# Patient Record
Sex: Female | Born: 1975
Health system: Southern US, Community
[De-identification: ages and names within clinical notes are randomized; demographics above are authoritative.]

## PROBLEM LIST (undated history)

## (undated) DIAGNOSIS — L309 Dermatitis, unspecified: Secondary | ICD-10-CM

## (undated) DIAGNOSIS — I872 Venous insufficiency (chronic) (peripheral): Secondary | ICD-10-CM

## (undated) DIAGNOSIS — T783XXA Angioneurotic edema, initial encounter: Secondary | ICD-10-CM

## (undated) DIAGNOSIS — L509 Urticaria, unspecified: Secondary | ICD-10-CM

## (undated) DIAGNOSIS — I8393 Asymptomatic varicose veins of bilateral lower extremities: Secondary | ICD-10-CM

## (undated) HISTORY — DX: Angioneurotic edema, initial encounter: T78.3XXA

## (undated) HISTORY — DX: Venous insufficiency (chronic) (peripheral): I87.2

## (undated) HISTORY — DX: Urticaria, unspecified: L50.9

## (undated) HISTORY — DX: Dermatitis, unspecified: L30.9

## (undated) HISTORY — DX: Asymptomatic varicose veins of bilateral lower extremities: I83.93

---

## 1997-08-10 ENCOUNTER — Inpatient Hospital Stay (HOSPITAL_COMMUNITY): Admission: AD | Admit: 1997-08-10 | Discharge: 1997-08-12 | Payer: Self-pay | Admitting: *Deleted

## 1997-10-09 ENCOUNTER — Inpatient Hospital Stay (HOSPITAL_COMMUNITY): Admission: AD | Admit: 1997-10-09 | Discharge: 1997-10-11 | Payer: Self-pay | Admitting: *Deleted

## 2006-10-08 ENCOUNTER — Ambulatory Visit: Payer: Self-pay | Admitting: Obstetrics and Gynecology

## 2006-10-08 ENCOUNTER — Inpatient Hospital Stay (HOSPITAL_COMMUNITY): Admission: AD | Admit: 2006-10-08 | Discharge: 2006-10-08 | Payer: Self-pay | Admitting: Obstetrics and Gynecology

## 2006-10-10 ENCOUNTER — Ambulatory Visit (HOSPITAL_COMMUNITY): Admission: RE | Admit: 2006-10-10 | Discharge: 2006-10-10 | Payer: Self-pay | Admitting: Obstetrics and Gynecology

## 2006-10-23 ENCOUNTER — Ambulatory Visit: Payer: Self-pay | Admitting: Vascular Surgery

## 2006-10-23 ENCOUNTER — Other Ambulatory Visit: Admission: RE | Admit: 2006-10-23 | Discharge: 2006-10-23 | Payer: Self-pay | Admitting: Obstetrics & Gynecology

## 2006-10-23 ENCOUNTER — Encounter: Payer: Self-pay | Admitting: Obstetrics & Gynecology

## 2006-10-23 ENCOUNTER — Encounter (INDEPENDENT_AMBULATORY_CARE_PROVIDER_SITE_OTHER): Payer: Self-pay | Admitting: Gynecology

## 2006-10-23 ENCOUNTER — Ambulatory Visit (HOSPITAL_COMMUNITY): Admission: RE | Admit: 2006-10-23 | Discharge: 2006-10-23 | Payer: Self-pay | Admitting: Obstetrics & Gynecology

## 2006-10-23 ENCOUNTER — Encounter: Payer: Self-pay | Admitting: Physician Assistant

## 2006-10-23 ENCOUNTER — Ambulatory Visit: Payer: Self-pay | Admitting: Obstetrics & Gynecology

## 2006-10-28 ENCOUNTER — Inpatient Hospital Stay (HOSPITAL_COMMUNITY): Admission: AD | Admit: 2006-10-28 | Discharge: 2006-10-31 | Payer: Self-pay | Admitting: Obstetrics and Gynecology

## 2006-10-28 ENCOUNTER — Ambulatory Visit: Payer: Self-pay | Admitting: Obstetrics & Gynecology

## 2006-10-29 ENCOUNTER — Encounter: Payer: Self-pay | Admitting: *Deleted

## 2006-11-04 ENCOUNTER — Ambulatory Visit: Payer: Self-pay | Admitting: Obstetrics & Gynecology

## 2006-11-07 ENCOUNTER — Ambulatory Visit: Payer: Self-pay | Admitting: Obstetrics and Gynecology

## 2006-11-14 ENCOUNTER — Ambulatory Visit: Payer: Self-pay | Admitting: Family Medicine

## 2006-11-21 ENCOUNTER — Ambulatory Visit: Payer: Self-pay | Admitting: Family Medicine

## 2006-11-28 ENCOUNTER — Ambulatory Visit: Payer: Self-pay | Admitting: Family Medicine

## 2006-12-05 ENCOUNTER — Ambulatory Visit: Payer: Self-pay | Admitting: Family Medicine

## 2006-12-12 ENCOUNTER — Ambulatory Visit: Payer: Self-pay | Admitting: Obstetrics & Gynecology

## 2006-12-19 ENCOUNTER — Ambulatory Visit: Payer: Self-pay | Admitting: Family Medicine

## 2006-12-26 ENCOUNTER — Ambulatory Visit: Payer: Self-pay | Admitting: Obstetrics & Gynecology

## 2007-01-02 ENCOUNTER — Ambulatory Visit: Payer: Self-pay | Admitting: Obstetrics & Gynecology

## 2007-01-09 ENCOUNTER — Ambulatory Visit: Payer: Self-pay | Admitting: Obstetrics & Gynecology

## 2007-01-16 ENCOUNTER — Ambulatory Visit: Payer: Self-pay | Admitting: Obstetrics & Gynecology

## 2007-01-23 ENCOUNTER — Ambulatory Visit: Payer: Self-pay | Admitting: *Deleted

## 2007-01-30 ENCOUNTER — Ambulatory Visit: Payer: Self-pay | Admitting: Obstetrics & Gynecology

## 2007-02-06 ENCOUNTER — Ambulatory Visit: Payer: Self-pay | Admitting: *Deleted

## 2007-02-06 ENCOUNTER — Other Ambulatory Visit: Payer: Self-pay | Admitting: Obstetrics & Gynecology

## 2007-02-09 ENCOUNTER — Ambulatory Visit: Payer: Self-pay | Admitting: *Deleted

## 2007-02-09 ENCOUNTER — Inpatient Hospital Stay (HOSPITAL_COMMUNITY): Admission: AD | Admit: 2007-02-09 | Discharge: 2007-02-11 | Payer: Self-pay | Admitting: Gynecology

## 2007-12-03 ENCOUNTER — Emergency Department (HOSPITAL_COMMUNITY): Admission: EM | Admit: 2007-12-03 | Discharge: 2007-12-03 | Payer: Self-pay | Admitting: Emergency Medicine

## 2010-08-08 NOTE — Discharge Summary (Signed)
[name], [name]                  ACCOUNT NO.:  [id number]   MEDICAL RECORD NO.:  [id number]          PATIENT TYPE:  INP   LOCATION:  9154                          FACILITY:  [facility]   PHYSICIAN:  Alison Carne, [name]  DATE OF BIRTH:  12/06/1975   DATE OF ADMISSION:  10/28/2006  DATE OF DISCHARGE:  10/31/2006                               DISCHARGE SUMMARY   ADMISSION DIAGNOSES:  61. 35 year old, gravida 8, para 2, 0, 5, 2, at 23 weeks 6 days      gestation.  2. Preterm labor.  3. Shortened cervix.   DISCHARGE DIAGNOSES:  59. 35 year old, gravida 8, para 2, 0, 5, 2, at 24 weeks 2 days      gestation.  2. Shortened cervix.  3. History of preterm labor.   PROCEDURES:  1. The patient had an ultrasound performed on October 29, 2006 for fetal      position and assessment of the cervix.  The cervix was noted to be      1.4 cm in length, measured transvaginally with significant      funneling at the internal os.  2. Beta Methasone injection 12 mg intramuscularly time two 24 hours      apart.   CONSULTATIONS:  Dr. Margot Ables with Maternal Fetal Medicine.   COMPLICATIONS:  None.   BRIEF PERTINENT ADMISSION HISTORY:  The patient is a 35 year old,  gravida 8, para 2, 0, 5, 2, admitted at 23 weeks 6 days gestation with  evidence of preterm labor.  She was assessed with ultrasound on October 10, 2006 with cervical length noted to be 3.1 cm.  She had a follow up  ultrasound on October 23, 2006 which showed a change in her cervical length  to 1.8 cm.  She was seen in clinic by Dr. Penne Lash and a cervical exam  digitally revealed a loose 1 cm cervix with 80% effacement.  She was  admitted for bed rest, tocolytic therapy with Procardia, and Maternal  Fetal Medicine consult.   HOSPITAL COURSE:  The patient was admitted, placed on bed rest with  fetal heart rate and tocometer monitoring three times daily.  She was  placed on Procardia 30 mg XL twice daily.  Beta Methasone injections  intramuscularly 12  mg 24 hours apart times 2 were administered.  The  patient was monitored with fetal heart tracing and tocometer three times  daily.  She was found not to be contracting and baby's heart rate was  reassuring.  She was assessed with an ultrasound on October 29, 2006  showing cervical length of 1.4 cm.  Maternal Fetal Medicine was  consulted and they recommended the addition of dilueton intramuscularly  250 mg weekly.  Fetal fibronectin was taken on October 30, 2006 and found  to be negative.  The patient was stable without contractions, doing well  and seemed fit for discharge.   DISCHARGE MEDICATIONS:  1. Prenatal vitamins 1 p.o. daily.  2. Colace 100 mg 1 p.o. b.i.d.  3. Procardia 30 mg XL 1 p.o. every 12 hours.  4  Dilueton 250 mg  IM once weekly at high risk clinic.   DISCHARGE INSTRUCTIONS:  1. Discharge to home.  2. Regular diet.  3. Bed rest with bathroom privileges.  4. Pelvic rest.  The patient is instructed to not have sex or place      anything in the vagina.  5. The patient is to follow up on Monday, November 04, 2006 at the [facility].  Appointment has been made for 8:45 on Monday, November 04, 2006.      [name], [name]  Electronically Signed     ______________________________  Alison Carne, [name]    NS/MEDQ  D:  10/31/2006  T:  10/31/2006  Job:  [id number]

## 2011-01-02 LAB — POCT URINALYSIS DIP (DEVICE)
Glucose, UA: NEGATIVE
Glucose, UA: NEGATIVE
Ketones, ur: NEGATIVE
Nitrite: NEGATIVE
Operator id: 120861
Operator id: 159681
Protein, ur: 30 — AB
Protein, ur: NEGATIVE
Urobilinogen, UA: 0.2
Urobilinogen, UA: 1

## 2011-01-02 LAB — CBC
MCHC: 34.3
MCV: 90.7
Platelets: 152
RBC: 3.33 — ABNORMAL LOW
WBC: 7.8

## 2011-01-03 LAB — POCT URINALYSIS DIP (DEVICE)
Glucose, UA: NEGATIVE
Nitrite: NEGATIVE
Nitrite: NEGATIVE
Operator id: 134861
Operator id: 135281
Protein, ur: NEGATIVE
Protein, ur: NEGATIVE
Specific Gravity, Urine: 1.015
Urobilinogen, UA: 0.2
Urobilinogen, UA: 0.2

## 2011-01-04 LAB — POCT URINALYSIS DIP (DEVICE)
Nitrite: NEGATIVE
Operator id: 135281
Operator id: 148111
Protein, ur: NEGATIVE
Protein, ur: NEGATIVE
Specific Gravity, Urine: 1.015
Specific Gravity, Urine: 1.015
Urobilinogen, UA: 0.2
Urobilinogen, UA: 0.2

## 2011-01-05 LAB — POCT URINALYSIS DIP (DEVICE)
Operator id: 297281
Specific Gravity, Urine: 1.015
Urobilinogen, UA: 1

## 2011-01-08 LAB — POCT URINALYSIS DIP (DEVICE)
Bilirubin Urine: NEGATIVE
Glucose, UA: NEGATIVE
Glucose, UA: NEGATIVE
Hgb urine dipstick: NEGATIVE
Hgb urine dipstick: NEGATIVE
Nitrite: NEGATIVE
Operator id: 148111
Operator id: 134861
Protein, ur: 30 — AB
Specific Gravity, Urine: 1.015
Specific Gravity, Urine: 1.025
Specific Gravity, Urine: 1.02
Urobilinogen, UA: 1
Urobilinogen, UA: 0.2

## 2011-01-08 LAB — CBC
HCT: 31.9 — ABNORMAL LOW
Hemoglobin: 10.7 — ABNORMAL LOW
MCV: 91.9
RBC: 3.47 — ABNORMAL LOW
WBC: 6.6

## 2011-01-08 LAB — URINALYSIS, ROUTINE W REFLEX MICROSCOPIC
Glucose, UA: NEGATIVE
Hgb urine dipstick: NEGATIVE
Specific Gravity, Urine: 1.02
Urobilinogen, UA: 1

## 2014-06-07 ENCOUNTER — Encounter (HOSPITAL_COMMUNITY): Payer: Self-pay | Admitting: Emergency Medicine

## 2014-06-07 ENCOUNTER — Emergency Department (HOSPITAL_COMMUNITY)
Admission: EM | Admit: 2014-06-07 | Discharge: 2014-06-07 | Disposition: A | Discharge status: Home / Self Care | Payer: No Typology Code available for payment source | Attending: Emergency Medicine | Admitting: Emergency Medicine

## 2014-06-07 DIAGNOSIS — S199XXA Unspecified injury of neck, initial encounter: Secondary | ICD-10-CM | POA: Insufficient documentation

## 2014-06-07 DIAGNOSIS — M542 Cervicalgia: Secondary | ICD-10-CM

## 2014-06-07 DIAGNOSIS — Y998 Other external cause status: Secondary | ICD-10-CM | POA: Diagnosis not present

## 2014-06-07 DIAGNOSIS — Y92410 Unspecified street and highway as the place of occurrence of the external cause: Secondary | ICD-10-CM | POA: Insufficient documentation

## 2014-06-07 DIAGNOSIS — S299XXA Unspecified injury of thorax, initial encounter: Secondary | ICD-10-CM | POA: Diagnosis not present

## 2014-06-07 DIAGNOSIS — Y9389 Activity, other specified: Secondary | ICD-10-CM | POA: Diagnosis not present

## 2014-06-07 MED ORDER — IBUPROFEN 600 MG PO TABS: 600.0000 mg | ORAL_TABLET | Freq: Four times a day (QID) | ORAL | Status: DC | PRN

## 2014-06-07 MED ORDER — METHOCARBAMOL 500 MG PO TABS: 500.0000 mg | ORAL_TABLET | Freq: Two times a day (BID) | ORAL | Status: DC

## 2014-06-07 NOTE — ED Notes (Signed)
Declined W/C at D/C and was escorted to lobby by RN. 
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
## 2014-06-07 NOTE — Discharge Instructions (Signed)
Cervical Sprain °A cervical sprain is an injury in the neck in which the strong, fibrous tissues (ligaments) that connect your neck bones stretch or tear. Cervical sprains can range from mild to severe. Severe cervical sprains can cause the neck vertebrae to be unstable. This can lead to damage of the spinal cord and can result in serious nervous system problems. The amount of time it takes for a cervical sprain to get better depends on the cause and extent of the injury. Most cervical sprains heal in 1 to 3 weeks. °CAUSES  °Severe cervical sprains may be caused by:  °· Contact sport injuries (such as from football, rugby, wrestling, hockey, auto racing, gymnastics, diving, martial arts, or boxing).   °· Motor vehicle collisions.   °· Whiplash injuries. This is an injury from a sudden forward and backward whipping movement of the head and neck.  °· Falls.   °Mild cervical sprains may be caused by:  °· Being in an awkward position, such as while cradling a telephone between your ear and shoulder.   °· Sitting in a chair that does not offer proper support.   °· Working at a poorly designed computer station.   °· Looking up or down for long periods of time.   °SYMPTOMS  °· Pain, soreness, stiffness, or a burning sensation in the front, back, or sides of the neck. This discomfort may develop immediately after the injury or slowly, 24 hours or more after the injury.   °· Pain or tenderness directly in the middle of the back of the neck.   °· Shoulder or upper back pain.   °· Limited ability to move the neck.   °· Headache.   °· Dizziness.   °· Weakness, numbness, or tingling in the hands or arms.   °· Muscle spasms.   °· Difficulty swallowing or chewing.   °· Tenderness and swelling of the neck.   °DIAGNOSIS  °Most of the time your health care provider can diagnose a cervical sprain by taking your history and doing a physical exam. Your health care provider will ask about previous neck injuries and any known neck  problems, such as arthritis in the neck. X-rays may be taken to find out if there are any other problems, such as with the bones of the neck. Other tests, such as a CT scan or MRI, may also be needed.  °TREATMENT  °Treatment depends on the severity of the cervical sprain. Mild sprains can be treated with rest, keeping the neck in place (immobilization), and pain medicines. Severe cervical sprains are immediately immobilized. Further treatment is done to help with pain, muscle spasms, and other symptoms and may include: °· Medicines, such as pain relievers, numbing medicines, or muscle relaxants.   °· Physical therapy. This may involve stretching exercises, strengthening exercises, and posture training. Exercises and improved posture can help stabilize the neck, strengthen muscles, and help stop symptoms from returning.   °HOME CARE INSTRUCTIONS  °· Put ice on the injured area.   °¨ Put ice in a plastic bag.   °¨ Place a towel between your skin and the bag.   °¨ Leave the ice on for 15-20 minutes, 3-4 times a day.   °· If your injury was severe, you may have been given a cervical collar to wear. A cervical collar is a two-piece collar designed to keep your neck from moving while it heals. °¨ Do not remove the collar unless instructed by your health care provider. °¨ If you have long hair, keep it outside of the collar. °¨ Ask your health care provider before making any adjustments to your collar. Minor   adjustments may be required over time to improve comfort and reduce pressure on your chin or on the back of your head.  Ifyou are allowed to remove the collar for cleaning or bathing, follow your health care provider's instructions on how to do so safely.  Keep your collar clean by wiping it with mild soap and water and drying it completely. If the collar you have been given includes removable pads, remove them every 1-2 days and hand wash them with soap and water. Allow them to air dry. They should be completely  dry before you wear them in the collar.  If you are allowed to remove the collar for cleaning and bathing, wash and dry the skin of your neck. Check your skin for irritation or sores. If you see any, tell your health care provider.  Do not drive while wearing the collar.   Only take over-the-counter or prescription medicines for pain, discomfort, or fever as directed by your health care provider.   Keep all follow-up appointments as directed by your health care provider.   Keep all physical therapy appointments as directed by your health care provider.   Make any needed adjustments to your workstation to promote good posture.   Avoid positions and activities that make your symptoms worse.   Warm up and stretch before being active to help prevent problems.  SEEK MEDICAL CARE IF:   Your pain is not controlled with medicine.   You are unable to decrease your pain medicine over time as planned.   Your activity level is not improving as expected.  SEEK IMMEDIATE MEDICAL CARE IF:   You develop any bleeding.  You develop stomach upset.  You have signs of an allergic reaction to your medicine.   Your symptoms get worse.   You develop new, unexplained symptoms.   You have numbness, tingling, weakness, or paralysis in any part of your body.  MAKE SURE YOU:   Understand these instructions.  Will watch your condition.  Will get help right away if you are not doing well or get worse. Document Released: 01/07/2007 Document Revised: 03/17/2013 Document Reviewed: 09/17/2012 [facility] Patient Information 2015 [place]. This information is not intended to replace advice given to you by your health care provider. Make sure you discuss any questions you have with your health care provider.  Pt advised to to rest use ice, ibuprofen and muscle relaxants as needed. Monitor for new or worsening symptoms and follow up with PCP for further evaluation if symptoms do not  improve.
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
## 2015-09-15 ENCOUNTER — Ambulatory Visit (INDEPENDENT_AMBULATORY_CARE_PROVIDER_SITE_OTHER): Payer: Self-pay | Admitting: Family Medicine

## 2015-09-15 VITALS — BP 108/68 | HR 79 | Temp 98.4°F | Resp 17 | Ht 66.5 in | Wt 131.0 lb

## 2015-09-15 DIAGNOSIS — L03115 Cellulitis of right lower limb: Secondary | ICD-10-CM

## 2015-09-15 LAB — BASIC METABOLIC PANEL
BUN: 8 mg/dL (ref 7–25)
CALCIUM: 9.1 mg/dL (ref 8.6–10.2)
CO2: 27 mmol/L (ref 20–31)
CREATININE: 0.82 mg/dL (ref 0.50–1.10)
Chloride: 101 mmol/L (ref 98–110)
GLUCOSE: 82 mg/dL (ref 65–99)
Potassium: 4.1 mmol/L (ref 3.5–5.3)
Sodium: 137 mmol/L (ref 135–146)

## 2015-09-15 LAB — POCT CBC
GRANULOCYTE PERCENT: 57.4 % (ref 37–80)
HEMATOCRIT: 34.7 % — AB (ref 37.7–47.9)
HEMOGLOBIN: 12.1 g/dL — AB (ref 12.2–16.2)
LYMPH, POC: 2 (ref 0.6–3.4)
MCH, POC: 29.5 pg (ref 27–31.2)
MCHC: 34.9 g/dL (ref 31.8–35.4)
MCV: 84.7 fL (ref 80–97)
MID (cbc): 0.3 (ref 0–0.9)
MPV: 8.5 fL (ref 0–99.8)
POC GRANULOCYTE: 3 (ref 2–6.9)
POC LYMPH %: 37.6 % (ref 10–50)
POC MID %: 5 % (ref 0–12)
Platelet Count, POC: 218 10*3/uL (ref 142–424)
RBC: 4.09 M/uL (ref 4.04–5.48)
RDW, POC: 14.1 %
WBC: 5.3 10*3/uL (ref 4.6–10.2)

## 2015-09-15 MED ORDER — CEPHALEXIN 500 MG PO CAPS
500.0000 mg | ORAL_CAPSULE | Freq: Four times a day (QID) | ORAL | Status: DC
Start: 2015-09-15 — End: 2015-10-08

## 2015-09-15 NOTE — Patient Instructions (Addendum)
Please come back in about 48 hours for a recheck. If you develop fever, streaking up your leg or worsening swelling and pain on antibiotics, please come back in or go to the ED.  Cellulitis Cellulitis is an infection of the skin and the tissue beneath it. The infected area is usually red and tender. Cellulitis occurs most often in the arms and lower legs.  CAUSES  Cellulitis is caused by bacteria that enter the skin through cracks or cuts in the skin. The most common types of bacteria that cause cellulitis are staphylococci and streptococci. SIGNS AND SYMPTOMS   Redness and warmth.  Swelling.  Tenderness or pain.  Fever. DIAGNOSIS  Your health care provider can usually determine what is wrong based on a physical exam. Blood tests may also be done. TREATMENT  Treatment usually involves taking an antibiotic medicine. HOME CARE INSTRUCTIONS   Take your antibiotic medicine as directed by your health care provider. Finish the antibiotic even if you start to feel better.  Keep the infected arm or leg elevated to reduce swelling.  Apply a warm cloth to the affected area up to 4 times per day to relieve pain.  Take medicines only as directed by your health care provider.  Keep all follow-up visits as directed by your health care provider. SEEK MEDICAL CARE IF:   You notice red streaks coming from the infected area.  Your red area gets larger or turns dark in color.  Your bone or joint underneath the infected area becomes painful after the skin has healed.  Your infection returns in the same area or another area.  You notice a swollen bump in the infected area.  You develop new symptoms.  You have a fever. SEEK IMMEDIATE MEDICAL CARE IF:   You feel very sleepy.  You develop vomiting or diarrhea.  You have a general ill feeling (malaise) with muscle aches and pains.   This information is not intended to replace advice given to you by your health care provider. Make sure you  discuss any questions you have with your health care provider.   Document Released: 12/20/2004 Document Revised: 12/01/2014 Document Reviewed: 05/28/2011 Elsevier Interactive Patient Education 2016 [employer]    IF you received an x-ray today, you will receive an invoice from [facility]. Please contact [facility] at [phone] with questions or concerns regarding your invoice.   IF you received labwork today, you will receive an invoice from [facility]. Please contact [facility] at [phone] with questions or concerns regarding your invoice.   Our billing staff will not be able to assist you with questions regarding bills from these companies.  You will be contacted with the lab results as soon as they are available. The fastest way to get your results is to activate your My Chart account. Instructions are located on the last page of this paperwork. If you have not heard from us regarding the results in 2 weeks, please contact this office.

## 2015-09-15 NOTE — Progress Notes (Signed)
   Subjective:    Patient ID: [name], female    DOB: 07/06/1975, 40 y.o.   MRN: [id number]  HPI This is a 40 yo female who presents today with right lower leg swelling and drainage. She thought she might have had a bug or spider bite about 7 days ago. She was using neosporyn and thought it was getting better until last night when it got more painful, red and swollen. It began to weep.  She thinks she has bad circulation, she swells frequently in lower legs. 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 She has an 42 year old son and has not had regular medical care since he was born.   No past medical history on file. No past surgical history on file. No family history on file. Social History  Substance Use Topics  . Smoking status: Never Smoker   . Smokeless tobacco: Not on file  . Alcohol Use: No    Review of Systems Per HPI    Objective:   Physical Exam  Constitutional: She is oriented to person, place, and time. She appears well-developed and well-nourished. No distress.  Eyes: Conjunctivae are normal.  Cardiovascular: Normal rate.   Pulmonary/Chest: Effort normal.  Musculoskeletal: Normal range of motion. She exhibits edema.  Neurological: She is alert and oriented to person, place, and time.  Skin: Skin is warm and dry. She is not diaphoretic.  Right medial lower leg with swelling and weeping. Area of induration marked with skin marker. Left leg with +1 edema. Bilateral legs with multiple, small varicosities. Chronic appearing darkening of skin.   Psychiatric: She has a normal mood and affect. Her behavior is normal. Judgment and thought content normal.  Vitals reviewed.     BP 108/68 mmHg  Pulse 79  Temp(Src) 98.4 F (36.9 C) (Oral)  Resp 17  Ht 5' 6.5" (1.689 m)  Wt 131 lb (59.421 kg)  BMI 20.83 kg/m2  SpO2 100%  LMP 08/31/2015     Assessment & Plan:  1. Cellulitis of leg, right - POCT CBC - Basic metabolic panel - cephALEXin (KEFLEX) 500 MG  capsule; Take 1 capsule (500 mg total) by mouth 4 (four) times daily.  Dispense: 40 capsule; Refill: 0 - follow up in 2 days, sooner if worsening, fever/chills  Olean Reeeborah Gessner, FNP-BC  [facility], [facility]  09/19/2015 7:57 PM

## 2015-09-16 ENCOUNTER — Telehealth: Payer: Self-pay

## 2015-09-16 ENCOUNTER — Telehealth: Payer: Self-pay | Admitting: Emergency Medicine

## 2015-09-16 NOTE — Telephone Encounter (Signed)
Pt was given keflex yesterday for cellulitis of her leg, she is now experience more itching which she believes is a nervous itch not a reaction, because she says that it was happening before in the waiting room yesterday but she is wanting to know if she could take benadryl with the keflex   Best number [phone]

## 2015-09-16 NOTE — Telephone Encounter (Signed)
Pt was given keflex yesterday for cellulitis of her leg, she is now experience more itching which she believes is a nervous itch not a reaction, because she says that it was happening before in the waiting room yesterday but she is wanting to know if she could take benadryl with the keflex  ° °Best number [phone] °

## 2015-09-17 ENCOUNTER — Ambulatory Visit (INDEPENDENT_AMBULATORY_CARE_PROVIDER_SITE_OTHER): Payer: Self-pay | Admitting: Family Medicine

## 2015-09-17 VITALS — BP 124/70 | HR 90 | Temp 98.4°F | Resp 18 | Ht 66.5 in | Wt 131.0 lb

## 2015-09-17 DIAGNOSIS — I83893 Varicose veins of bilateral lower extremities with other complications: Secondary | ICD-10-CM

## 2015-09-17 DIAGNOSIS — L03115 Cellulitis of right lower limb: Secondary | ICD-10-CM

## 2015-09-17 MED ORDER — DOXYCYCLINE HYCLATE 100 MG PO CAPS: 100.0000 mg | ORAL_CAPSULE | Freq: Two times a day (BID) | ORAL | Status: DC

## 2015-09-17 NOTE — Telephone Encounter (Signed)
Patient wants to know if she can take Benadryl while she is taking Keflex. Patient is taking Benadryl because she is having a itching.

## 2015-09-17 NOTE — Progress Notes (Signed)
   Subjective:    Patient ID: [name], female    DOB: 08/17/1975, 40 y.o.   MRN: [id number]  HPI This is a pleasant 40 yo female who presents today for follow up of right leg cellulitis. She has been tolerating keflex. She has had decreased drainage. She has pain with standing and after first getting up, pain gets better with walking around. Continues to have bilateral LE edema, right > left, she reports that her edema is not significantly worse than her baseline. She has started to have hives. Mild ones started prior to her starting keflex. They have gotten progressively worse, spreading to face, arms and hands. No difficulty breathing or swallowing. She has itching. Has been taking benadryl with temporary relief.   No past medical history on file. No past surgical history on file. No family history on file. Social History  Substance Use Topics  . Smoking status: Never Smoker   . Smokeless tobacco: None  . Alcohol Use: No    Review of Systems No fever or chills     Objective:   Physical Exam  Constitutional: She is oriented to person, place, and time. She appears well-developed and well-nourished. No distress.  Cardiovascular: Normal rate.   Pulmonary/Chest: Effort normal.  Musculoskeletal: She exhibits edema (Right leg +3, left +1 ).  Neurological: She is alert and oriented to person, place, and time.  Skin: Skin is warm and dry. She is not diaphoretic.  Right medial lower leg with swelling, erythema and darkening. Marker line shows no increase in size of initial area of erythema.   Psychiatric: She has a normal mood and affect. Her behavior is normal. Judgment and thought content normal.  Vitals reviewed. BP 124/70 mmHg  Pulse 90  Temp(Src) 98.4 F (36.9 C) (Oral)  Resp 18  Ht 5' 6.5" (1.689 m)  Wt 131 lb (59.421 kg)  BMI 20.83 kg/m2  SpO2 100%  LMP 08/31/2015 Wt Readings from Last 3 Encounters:  09/17/15 131 lb (59.421 kg)  09/15/15 131 lb (59.421 kg)  06/07/14 133  lb 6 oz (60.499 kg)       Assessment & Plan:  Discussed with Dr. Katrinka BlazingSmith who also examined patient 1. Cellulitis of right lower extremity - offered her a different antibiotic since rash could be allergic reaction to Keflex - doxycycline (VIBRAMYCIN) 100 MG capsule; Take 1 capsule (100 mg total) by mouth 2 (two) times daily.  Dispense: 14 capsule; Refill: 0  2. Hives - Provided written and verbal information regarding diagnosis and treatment. - instructed to start cetirizine and take daily for 7-10 days - RTC precautions reviewed  3. Bilateral lower extremity edema - recommended compression hose and vascular work up when acute cellulitis resolved  Olean Reeeborah An Lannan, FNP-BC  [facility], [facility]  09/19/2015 8:22 PM

## 2015-09-17 NOTE — Patient Instructions (Addendum)
Please take a cetirizine daily for the hives (generic Zyrtec)  Continue to elevate and once healed you can use compression socks    IF you received an x-ray today, you will receive an invoice from [facility]. Please contact [facility] at [phone] with questions or concerns regarding your invoice.   IF you received labwork today, you will receive an invoice from [facility]. Please contact [facility] at [phone] with questions or concerns regarding your invoice.   Our billing staff will not be able to assist you with questions regarding bills from these companies.  You will be contacted with the lab results as soon as they are available. The fastest way to get your results is to activate your My Chart account. Instructions are located on the last page of this paperwork. If you have not heard from us regarding the results in 2 weeks, please contact this office.

## 2015-09-17 NOTE — Telephone Encounter (Signed)
Patient was seen in office today and questions were answered.

## 2015-09-18 ENCOUNTER — Emergency Department (HOSPITAL_BASED_OUTPATIENT_CLINIC_OR_DEPARTMENT_OTHER): Admit: 2015-09-18 | Discharge: 2015-09-18 | Disposition: A | Discharge status: Home / Self Care | Payer: Self-pay

## 2015-09-18 ENCOUNTER — Emergency Department (HOSPITAL_COMMUNITY)
Admission: EM | Admit: 2015-09-18 | Discharge: 2015-09-18 | Disposition: A | Discharge status: Home / Self Care | Payer: Self-pay | Attending: Emergency Medicine | Admitting: Emergency Medicine

## 2015-09-18 ENCOUNTER — Encounter (HOSPITAL_COMMUNITY): Payer: Self-pay | Admitting: *Deleted

## 2015-09-18 DIAGNOSIS — L03115 Cellulitis of right lower limb: Secondary | ICD-10-CM | POA: Insufficient documentation

## 2015-09-18 DIAGNOSIS — R21 Rash and other nonspecific skin eruption: Secondary | ICD-10-CM | POA: Insufficient documentation

## 2015-09-18 DIAGNOSIS — I10 Essential (primary) hypertension: Secondary | ICD-10-CM | POA: Insufficient documentation

## 2015-09-18 DIAGNOSIS — R220 Localized swelling, mass and lump, head: Secondary | ICD-10-CM | POA: Insufficient documentation

## 2015-09-18 DIAGNOSIS — R609 Edema, unspecified: Secondary | ICD-10-CM

## 2015-09-18 DIAGNOSIS — T361X5A Adverse effect of cephalosporins and other beta-lactam antibiotics, initial encounter: Secondary | ICD-10-CM | POA: Insufficient documentation

## 2015-09-18 DIAGNOSIS — T7840XA Allergy, unspecified, initial encounter: Secondary | ICD-10-CM

## 2015-09-18 MED ORDER — DIPHENHYDRAMINE HCL 25 MG PO CAPS
25.0000 mg | ORAL_CAPSULE | Freq: Once | ORAL | Status: AC
  Administered 2015-09-18: 25 mg via ORAL
  Filled 2015-09-18: qty 1

## 2015-09-18 MED ORDER — PREDNISONE 20 MG PO TABS
ORAL_TABLET | ORAL | Status: DC
Start: 2015-09-19 — End: 2015-10-08

## 2015-09-18 MED ORDER — PREDNISONE 20 MG PO TABS
60.0000 mg | ORAL_TABLET | Freq: Once | ORAL | Status: AC
  Administered 2015-09-18: 60 mg via ORAL
  Filled 2015-09-18: qty 3

## 2015-09-18 MED ORDER — RANITIDINE HCL 150 MG PO TABS: 150.0000 mg | ORAL_TABLET | Freq: Two times a day (BID) | ORAL | Status: DC

## 2015-09-18 MED ORDER — DIPHENHYDRAMINE HCL 25 MG PO TABS: 50.0000 mg | ORAL_TABLET | ORAL | Status: DC | PRN

## 2015-09-18 MED ORDER — FAMOTIDINE 20 MG PO TABS
20.0000 mg | ORAL_TABLET | Freq: Once | ORAL | Status: AC
Start: 2015-09-18 — End: 2015-09-18
  Administered 2015-09-18: 20 mg via ORAL
  Filled 2015-09-18: qty 1

## 2015-09-18 NOTE — ED Notes (Signed)
Pt reports infection to right lower leg and [facility], was started on keflex. Has rash and itching to body and states the itching started prior to taking antibiotics. Has swelling to eyes and rash to face. Airway intact.

## 2015-09-18 NOTE — Discharge Instructions (Signed)
Take Prednisone, Benadryl, and Zantac as directed for your allergic reaction. Use tylenol/motrin as needed for pain, keep your leg elevated to help with pain and swelling. STOP TAKING KEFLEX, start taking the doxycycline prescription that the urgent care gave you yesterday. Take prednisone daily with breakfast, starting tomorrow since you were given today's dose here. Continue your other usual home medications. Get plenty of rest and drink plenty of fluids. Avoid any known triggers. Follow up with [facility] in 2 days for wound recheck and ongoing management of your right leg cellulitis, and follow up with [facility] in 1 week to establish medical care and recheck symptoms. Your blood pressure was mildly elevated today, eat a low-salt diet and follow up with [facility] for this as well. Return to the ER for changes or worsening symptoms     Cellulitis Cellulitis is an infection of the skin and the tissue under the skin. The infected area is usually red and tender. This happens most often in the arms and lower legs. HOME CARE   Take your antibiotic medicine as told. Finish the medicine even if you start to feel better.  Keep the infected arm or leg raised (elevated).  Put a warm cloth on the area up to 4 times per day.  Only take medicines as told by your doctor.  Keep all doctor visits as told. GET HELP IF:  You see red streaks on the skin coming from the infected area.  Your red area gets bigger or turns a dark color.  Your bone or joint under the infected area is painful after the skin heals.  Your infection comes back in the same area or different area.  You have a puffy (swollen) bump in the infected area.  You have new symptoms.  You have a fever. GET HELP RIGHT AWAY IF:   You feel very sleepy.  You throw up (vomit) or have watery poop (diarrhea).  You feel sick and have muscle aches and pains.   This information is not intended to  replace advice given to you by your health care provider. Make sure you discuss any questions you have with your health care provider.   Document Released: 08/29/2007 Document Revised: 12/01/2014 Document Reviewed: 05/28/2011 Elsevier Interactive Patient Education 2016 [employer].  Drug Rash A drug rash is a change in the color or texture of the skin that is caused by a drug. It can develop minutes, hours, or days after the person takes the drug. CAUSES This condition is usually caused by a drug allergy. It can also be caused by exposure to sunlight after taking a drug that makes the skin sensitive to light. Drugs that commonly cause rashes include:  Penicillin.  Antibiotic medicines.  Medicines that treat seizures.  Medicines that treat cancer (chemotherapy).  Aspirin and other nonsteroidal anti-inflammatory drugs (NSAIDs).  Injectable dyes that contain iodine.  Insulin. SYMPTOMS Symptoms of this condition include:  Redness.  Tiny bumps.  Peeling.  Itching.  Itchy welts (hives).  Swelling. The rash may appear on a small area of skin or all over the body. DIAGNOSIS To diagnose the condition, your health care provider will do a physical exam. He or she may also order tests to find out which drug caused the rash. Tests to find the cause of a rash include:  Skin tests.  Blood tests.  Drug challenge. For this test, you stop taking all of the drugs that you do not need to take,  and then you start taking them again by adding back one of the drugs at a time. TREATMENT A drug rash may be treated with medicines, including:  Antihistamines. These may be given to relieve itching.  An NSAID. This may be given to reduce swelling and treat pain.  A steroid drug. This may be given to reduce swelling. The rash usually goes away when the person stops taking the drug that caused it. HOME CARE INSTRUCTIONS  Take medicines only as directed by your health care provider.  Let  all of your health care providers know about any drug reactions you have had in the past.  If you have hives, take a cool shower or use a cool compress to relieve itchiness. SEEK MEDICAL CARE IF:  You have a fever.  Your rash is not going away.  Your rash gets worse.  Your rash comes back.  You have wheezing or coughing. SEEK IMMEDIATE MEDICAL CARE IF:  You start to have breathing problems.  You start to have shortness of breath.  You face or throat starts to swell.  You have severe weakness with dizziness or fainting.  You have chest pain.   This information is not intended to replace advice given to you by your health care provider. Make sure you discuss any questions you have with your health care provider.   Document Released: 04/19/2004 Document Revised: 04/02/2014 Document Reviewed: 01/06/2014 Elsevier Interactive Patient Education [employer]  Allergies An allergy is when your body reacts to a substance in a way that is not normal. An allergic reaction can happen after you:  Eat something.  Breathe in something.  Touch something. WHAT KINDS OF ALLERGIES ARE THERE? You can be allergic to:  Things that are only around during certain seasons, like molds and pollens.  Foods.  Drugs.  Insects.  Animal dander. WHAT ARE SYMPTOMS OF ALLERGIES?  Puffiness (swelling). This may happen on the lips, face, tongue, mouth, or throat.  Sneezing.  Coughing.  Breathing loudly (wheezing).  Stuffy nose.  Tingling in the mouth.  A rash.  Itching.  Itchy, red, puffy areas of skin (hives).  Watery eyes.  Throwing up (vomiting).  Watery poop (diarrhea).  Dizziness.  Feeling faint or fainting.  Trouble breathing or swallowing.  A tight feeling in the chest.  A fast heartbeat. HOW ARE ALLERGIES DIAGNOSED? Allergies can be diagnosed with:  A medical and family history.  Skin tests.  Blood tests.  A food diary. A food diary is a record  of all the foods, drinks, and symptoms you have each day.  The results of an elimination diet. This diet involves making sure not to eat certain foods and then seeing what happens when you start eating them again. HOW ARE ALLERGIES TREATED? There is no cure for allergies, but allergic reactions can be treated with medicine. Severe reactions usually need to be treated at a hospital.  HOW CAN REACTIONS BE PREVENTED? The best way to prevent an allergic reaction is to avoid the thing you are allergic to. Allergy shots and medicines can also help prevent reactions in some cases.   This information is not intended to replace advice given to you by your health care provider. Make sure you discuss any questions you have with your health care provider.   Document Released: 07/07/2012 Document Revised: 04/02/2014 Document Reviewed: 12/22/2013 Elsevier Interactive Patient Education 2016 Elsevier Inc.  Angioedema Angioedema is sudden puffiness (swelling), often of the skin. It can happen:  On your  face or privates (genitals).  In your belly (abdomen) or other body parts. It usually happens quickly and gets better in 1 or 2 days. It often starts at night and is found when you wake up. You may get red, itchy patches of skin (hives). Attacks can be dangerous if your breathing passages get puffy. The condition may happen only once, or it can come back at random times. It may happen for several years before it goes away for good. HOME CARE  Only take medicines as told by your doctor.  Always carry your emergency allergy medicines with you.  Wear a medical bracelet as told by your doctor.  Avoid things that you know will cause attacks (triggers). GET HELP IF:  You have another attack.  Your attacks happen more often or get worse.  The condition was passed to you by your parents and you want to have children. GET HELP RIGHT AWAY IF:   Your mouth, tongue, or lips are very puffy.  You have  trouble breathing.  You have trouble swallowing.  You pass out (faint). MAKE SURE YOU:   Understand these instructions.  Will watch your condition.  Will get help right away if you are not doing well or get worse.   This information is not intended to replace advice given to you by your health care provider. Make sure you discuss any questions you have with your health care provider.   Document Released: 02/28/2009 Document Revised: 12/31/2012 Document Reviewed: 11/03/2012 Elsevier Interactive Patient Education 2016 [employer]  Hypertension Hypertension is another name for high blood pressure. High blood pressure forces your heart to work harder to pump blood. A blood pressure reading has two numbers, which includes a higher number over a lower number (example: 110/72). HOME CARE   Have your blood pressure rechecked by your doctor.  Only take medicine as told by your doctor. Follow the directions carefully. The medicine does not work as well if you skip doses. Skipping doses also puts you at risk for problems.  Do not smoke.  Monitor your blood pressure at home as told by your doctor. GET HELP IF:  You think you are having a reaction to the medicine you are taking.  You have repeat headaches or feel dizzy.  You have puffiness (swelling) in your ankles.  You have trouble with your vision. GET HELP RIGHT AWAY IF:   You get a very bad headache and are confused.  You feel weak, numb, or faint.  You get chest or belly (abdominal) pain.  You throw up (vomit).  You cannot breathe very well. MAKE SURE YOU:   Understand these instructions.  Will watch your condition.  Will get help right away if you are not doing well or get worse.   This information is not intended to replace advice given to you by your health care provider. Make sure you discuss any questions you have with your health care provider.   Document Released: 08/29/2007 Document Revised: 03/17/2013  Document Reviewed: 01/02/2013 Elsevier Interactive Patient Education 2016 Elsevier Inc.  DASH Eating Plan DASH stands for "Dietary Approaches to Stop Hypertension." The DASH eating plan is a healthy eating plan that has been shown to reduce high blood pressure (hypertension). Additional health benefits may include reducing the risk of type 2 diabetes mellitus, heart disease, and stroke. The DASH eating plan may also help with weight loss. WHAT DO I NEED TO KNOW ABOUT THE DASH EATING PLAN? For the DASH eating plan, you will  follow these general guidelines:  Choose foods with a percent daily value for sodium of less than 5% (as listed on the food label).  Use salt-free seasonings or herbs instead of table salt or sea salt.  Check with your health care provider or pharmacist before using salt substitutes.  Eat lower-sodium products, often labeled as "lower sodium" or "no salt added."  Eat fresh foods.  Eat more vegetables, fruits, and low-fat dairy products.  Choose whole grains. Look for the word "whole" as the first word in the ingredient list.  Choose fish and skinless chicken or [place] more often than red meat. Limit fish, poultry, and meat to 6 oz (170 g) each day.  Limit sweets, desserts, sugars, and sugary drinks.  Choose heart-healthy fats.  Limit cheese to 1 oz (28 g) per day.  Eat more home-cooked food and less restaurant, buffet, and fast food.  Limit fried foods.  Cook foods using methods other than frying.  Limit canned vegetables. If you do use them, rinse them well to decrease the sodium.  When eating at a restaurant, ask that your food be prepared with less salt, or no salt if possible. WHAT FOODS CAN I EAT? Seek help from a dietitian for individual calorie needs. Grains Whole grain or whole wheat bread. Brown rice. Whole grain or whole wheat pasta. Quinoa, bulgur, and whole grain cereals. Low-sodium cereals. Corn or whole wheat flour tortillas. Whole grain  cornbread. Whole grain crackers. Low-sodium crackers. Vegetables Fresh or frozen vegetables (raw, steamed, roasted, or grilled). Low-sodium or reduced-sodium tomato and vegetable juices. Low-sodium or reduced-sodium tomato sauce and paste. Low-sodium or reduced-sodium canned vegetables.  Fruits All fresh, canned (in natural juice), or frozen fruits. Meat and Other Protein Products Ground beef (85% or leaner), grass-fed beef, or beef trimmed of fat. Skinless chicken or [place]. Ground chicken or [place]. Pork trimmed of fat. All fish and seafood. Eggs. Dried beans, peas, or lentils. Unsalted nuts and seeds. Unsalted canned beans. Dairy Low-fat dairy products, such as skim or 1% milk, 2% or reduced-fat cheeses, low-fat ricotta or cottage cheese, or plain low-fat yogurt. Low-sodium or reduced-sodium cheeses. Fats and Oils Tub margarines without trans fats. Light or reduced-fat mayonnaise and salad dressings (reduced sodium). Avocado. Safflower, olive, or canola oils. Natural peanut or almond butter. Other Unsalted popcorn and pretzels. The items listed above may not be a complete list of recommended foods or beverages. Contact your dietitian for more options. WHAT FOODS ARE NOT RECOMMENDED? Grains White bread. White pasta. White rice. Refined cornbread. Bagels and croissants. Crackers that contain trans fat. Vegetables Creamed or fried vegetables. Vegetables in a cheese sauce. Regular canned vegetables. Regular canned tomato sauce and paste. Regular tomato and vegetable juices. Fruits Dried fruits. Canned fruit in light or heavy syrup. Fruit juice. Meat and Other Protein Products Fatty cuts of meat. Ribs, chicken wings, bacon, sausage, bologna, salami, chitterlings, fatback, hot dogs, bratwurst, and packaged luncheon meats. Salted nuts and seeds. Canned beans with salt. Dairy Whole or 2% milk, cream, half-and-half, and cream cheese. Whole-fat or sweetened yogurt. Full-fat cheeses or blue cheese.  Nondairy creamers and whipped toppings. Processed cheese, cheese spreads, or cheese curds. Condiments Onion and garlic salt, seasoned salt, table salt, and sea salt. Canned and packaged gravies. Worcestershire sauce. Tartar sauce. Barbecue sauce. Teriyaki sauce. Soy sauce, including reduced sodium. Steak sauce. Fish sauce. Oyster sauce. Cocktail sauce. Horseradish. Ketchup and mustard. Meat flavorings and tenderizers. Bouillon cubes. Hot sauce. Tabasco sauce. Marinades. Taco seasonings. Relishes. Fats and Oils  Butter, stick margarine, lard, shortening, ghee, and bacon fat. Coconut, palm kernel, or palm oils. Regular salad dressings. Other Pickles and olives. Salted popcorn and pretzels. The items listed above may not be a complete list of foods and beverages to avoid. Contact your dietitian for more information. WHERE CAN I FIND MORE INFORMATION? National Heart, Lung, and Blood Institute: [employer].itwww.nhlbi.nih.gov/health/health-topics/topics/dash/   This information is not intended to replace advice given to you by your health care provider. Make sure you discuss any questions you have with your health care provider.   Document Released: 03/01/2011 Document Revised: 04/02/2014 Document Reviewed: 01/14/2013 Elsevier Interactive Patient Education [employer]

## 2015-09-18 NOTE — ED Notes (Signed)
Pt remains out of room for testing 

## 2015-09-18 NOTE — Progress Notes (Signed)
VASCULAR LAB PRELIMINARY  PRELIMINARY  PRELIMINARY  PRELIMINARY  Right lower extremity venous duplex completed.    Preliminary report:  There is no DVT or SVT noted in the right lower extremity.   Edison Wollschlager, RVT 09/18/2015, 6:16 PM

## 2015-09-18 NOTE — ED Provider Notes (Signed)
CSN: [id number]     Arrival date & time 09/18/15  1433 History   First MD Initiated Contact with Patient 09/18/15 1642     Chief Complaint  Patient presents with  . Rash     (Consider location/radiation/quality/duration/timing/severity/associated sxs/prior Treatment) HPI Comments: [name] is a 40 y.o. Female who presents to the ED with complaints of rash and allergic reaction. Patient states that on Thursday (3 days ago) she was seen at [facility] urgent care for right leg cellulitis after what she thought might be a spider bite, she was started on Keflex, had noticed some itching to her arms that day prior but had no rash or hives prior to starting Keflex, but after beginning her Keflex regimen she developed hives and diffuse body itching as well as periorbital swelling. She took Benadryl with some relief, but the symptoms persisted so she went back to [facility] urgent care yesterday, they prescribed her doxycycline but told her to wait and see if she continued to have the hives with continued Keflex use (since she had some symptoms prior to keflex so it wasn't clear if this was definitely an allergic rxn), so she has not started doxycycline yet. She was told to take cetirizine for her hives, but she has only been taking Benadryl, last dose of Benadryl was around 1 PM when she took approximately 15 mg of children's Benadryl. She continues to have the diffuse body rash which is spreading and causing more periorbital swelling, so she came here. She states the right leg cellulitis is improving, continues to have some warmth, redness, and weepage from a wound on the leg, but overall it's improved. States that the redness has not spread, denies red streaking, denies purulent drainage or worsening swelling. 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 She describes the rash as diffuse over her entire body/face, itchy, red hives and periorbital swelling, improved with Benadryl, with no known aggravating factors.  Denies any tongue or lip swelling, difficulty swallowing, drooling, wheezing, fevers, chills, chest pain, shortness of breath, abdominal pain, nausea, vomiting, diarrhea, constipation, dysuria, hematuria, numbness, tingling, or focal weakness. She denies any recent changes in soaps or detergents, recent travel/surgery/immobilization, recent changes in living environment, affected individuals at home, or animal/plant contacts. No known sick contacts.   Patient is a 40 y.o. female presenting with rash. The history is provided by the patient and medical records. No language interpreter was used.  Rash Location:  Full body Quality: itchiness and redness   Severity:  Moderate Onset quality:  Gradual Duration:  2 days Timing:  Constant Progression:  Spreading Chronicity:  New Context: insect bite/sting (possible insect bite RLE) and medications   Context: not animal contact, not exposure to similar rash, not new detergent/soap, not plant contact and not sick contacts   Relieved by:  Antihistamines Worsened by:  Continued exposure to allergens Ineffective treatments:  None tried Associated symptoms: periorbital edema   Associated symptoms: no abdominal pain, no diarrhea, no fever, no joint pain, no myalgias, no nausea, no shortness of breath, no sore throat, no throat swelling, no tongue swelling, not vomiting and not wheezing     History reviewed. No pertinent past medical history. History reviewed. No pertinent past surgical history. History reviewed. No pertinent family history. Social History  Substance Use Topics  .  Smoking status: Never Smoker   . Smokeless tobacco: None  . Alcohol Use: No   OB History    No data available     Review of Systems  Constitutional: Negative for fever and chills.  HENT: Negative  for drooling, sore throat and trouble swallowing.   Respiratory: Negative for shortness of breath and wheezing.   Cardiovascular: Positive for leg swelling (chronic RLE swelling, at baseline). Negative for chest pain.  Gastrointestinal: Negative for nausea, vomiting, abdominal pain, diarrhea and constipation.  Genitourinary: Negative for dysuria and hematuria.  Musculoskeletal: Negative for myalgias and arthralgias.  Skin: Positive for color change, rash and wound.  Allergic/Immunologic: Negative for immunocompromised state.  Neurological: Negative for weakness and numbness.  Psychiatric/Behavioral: Negative for confusion.   10 Systems reviewed and are negative for acute change except as noted in the HPI.    Allergies  Review of patient's allergies indicates no known allergies.  Home Medications   Prior to Admission medications   Medication Sig Start Date End Date Taking? Authorizing Provider  cephALEXin (KEFLEX) 500 MG capsule Take 1 capsule (500 mg total) by mouth 4 (four) times daily. 09/15/15   Emi Belfasteborah B Gessner, FNP  doxycycline (VIBRAMYCIN) 100 MG capsule Take 1 capsule (100 mg total) by mouth 2 (two) times daily. 09/17/15   Emi Belfasteborah B Gessner, FNP   Triage VS: BP 145/89 mmHg  Pulse 115  Temp(Src) 98.4 F (36.9 C) (Oral)  Resp 22  SpO2 100%  LMP 08/31/2015 Re-check VS: BP 148/92 mmHg  Pulse 95  Temp(Src) 98.4 F (36.9 C) (Oral)  Resp 20  SpO2 100%  LMP 08/31/2015  Physical Exam  Constitutional: She is oriented to person, place, and time. Vital signs are normal. She appears well-developed and well-nourished.  Non-toxic appearance. No distress.  Afebrile, nontoxic, NAD  HENT:  Head: Normocephalic and atraumatic.  Nose: Nose normal.  Mouth/Throat: Uvula is midline, oropharynx is clear and moist and mucous membranes are normal. No trismus in the jaw. No uvula swelling.  Ears are clear bilaterally. Nose clear. Oropharynx clear and moist, without uvular swelling or  deviation, no trismus or drooling, no tonsillar swelling or erythema, no exudates.   Airway patent No tongue/lip swelling Periorbital swelling and hives noted  Eyes: Conjunctivae and EOM are normal. Pupils are equal, round, and reactive to light. Right eye exhibits no discharge. Left eye exhibits no discharge.  Periorbital facial swelling  Neck: Normal range of motion. Neck supple.  Cardiovascular: Normal rate, regular rhythm, normal heart sounds and intact distal pulses.  Exam reveals no gallop and no friction rub.   No murmur heard. Pulmonary/Chest: Effort normal and breath sounds normal. No respiratory distress. She has no decreased breath sounds. She has no wheezes. She has no rhonchi. She has no rales.  Abdominal: Soft. Normal appearance and bowel sounds are normal. She exhibits no distension. There is no tenderness. There is no rigidity, no rebound and no guarding.  Musculoskeletal: Normal range of motion.  R leg with erythema and warmth to distal aspect of the leg, with a weeping wound to the center of this area, no focal fluctuance, minimally tender to this area, no purulent drainage, with 3+ pedal edema which pt states is baseline, neg homan's sign, distal pulses intact, compartments soft, FROM intact in all joints of the leg. Strength and sensation grossly intact  Neurological: She is alert and oriented to person, place, and time. She has normal strength. No sensory deficit.  Skin: Skin is warm, dry and intact. Rash  noted. Rash is urticarial.  Urticarial rash diffusely over entire body and face/extremities, erythematous without warmth to the rash, RLE cellulitis as noted above; no interdigital webspace involvement, no burrowing noted. No drainage from the rash, but RLE wound weeping as noted above. No red streaking.   Psychiatric: She has a normal mood and affect.  Nursing note and vitals reviewed.   ED Course  Procedures (including critical care time) Labs Review Labs Reviewed - No  data to display Results for Murlean CallerGREEN, Kingsley S (MRN [id number]) as of 09/18/2015 17:01  Ref. Range 09/15/2015 14:45 09/15/2015 15:01  Sodium Latest Ref Range: 135-146 mmol/L 137   Potassium Latest Ref Range: 3.5-5.3 mmol/L 4.1   Chloride Latest Ref Range: 98-110 mmol/L 101   CO2 Latest Ref Range: 20-31 mmol/L 27   BUN Latest Ref Range: 7-25 mg/dL 8   Creatinine Latest Ref Range: 0.50-1.10 mg/dL 4.090.82   Calcium Latest Ref Range: 8.6-10.2 mg/dL 9.1   Glucose Latest Ref Range: 65-99 mg/dL 82   WBC Latest Ref Range: 4.6-10.2 K/uL  5.3  RBC Latest Ref Range: 4.04-5.48 M/uL  4.09  Hemoglobin Latest Ref Range: 12.2-16.2 g/dL  81.112.1 (A)  HCT Latest Ref Range: 37.7-47.9 %  34.7 (A)  MCV Latest Ref Range: 80-97 fL  84.7  MCH Latest Ref Range: 27-31.2 pg  29.5  MCHC Latest Ref Range: 31.8-35.4 g/dL  91.434.9  MPV Latest Ref Range: 0-99.8 fL  8.5  Granulocyte percent Latest Ref Range: 37-80 %G  57.4  Platelet Count, POC Latest Ref Range: 142-424 K/uL  218  Lymph, poc Latest Ref Range: 0.6-3.4   2.0  MID (cbc) Latest Ref Range: 0-0.9   0.3  POC Granulocyte Latest Ref Range: 2-6.9   3.0  POC LYMPH PERCENT Latest Ref Range: 10-50 %L  37.6  POC MID % Latest Ref Range: 0-12 %M  5.0  RDW, POC Latest Units: %  14.1   Imaging Review No results found.   Progress Notes by Kern Albertaandace R Kanady, RVS at 09/18/2015 6:16 PM    Author: Kern Albertaandace R Kanady, RVS Service: Vascular Lab Author Type: Cardiovascular Sonographer   Filed: 09/18/2015 6:16 PM Note Time: 09/18/2015 6:16 PM Status: Signed   Editor: Kern Albertaandace R Kanady, RVS (Cardiovascular Sonographer)     Expand All Collapse All   VASCULAR LAB PRELIMINARY PRELIMINARY PRELIMINARY PRELIMINARY  Right lower extremity venous duplex completed.   Preliminary report: There is no DVT or SVT noted in the right lower extremity.   KANADY, CANDACE, RVT 09/18/2015, 6:16 PM      I have personally reviewed and evaluated these images and lab results as part of my medical  decision-making.   EKG Interpretation None      MDM   Final diagnoses:  Rash of entire body  Allergic reaction caused by a drug  Cellulitis of right leg  Facial swelling  HTN (hypertension), benign    40 y.o. female here with itching/rash/periorbital swelling since starting keflex for RLE cellulitis. Was having some itching prior to keflex, but didn't experience the rash/swelling until after. Went back to [facility] who reported that her cellulitis was improving, but switched her to doxy since they suspected that her hives/rash was due to allergic reaction. She didn't start taking this, continued taking the keflex since she wasn't convinced that was the cause-- continues to have symptoms so she came here. Labs on her initial visit at [facility] were reassuring, no leukocytosis or abnormal kidney function. On exam, RLE with 3+ pedal edema which pt states  is a chronic finding, NVI with soft compartments, erythema and warmth to distal RLE with weeping wound to the area, minimally tender over the erythema, no fluctuance or focal abscess. Facial swelling limited to periorbital region, no tongue/lip involvement, airway patent. Rash diffuse to entire body, no interdigital webspace involvement, doesn't seem like it'd be scabies especially since it started after keflex was started, and no affected individuals at home. Will treat for allergic rxn, using benadryl, pepcid, and prednisone. Will get RLE DVT U/S to r/o this possibility, and ensure we're treating the correct thing when we tx the cellulitis. Will reassess shortly. She declines needing anything else at this time.   6:59 PM  DVT study neg, so R leg redness/warmth is likely cellulitis. Will have her stop keflex and start doxycycline, complete course as directed by [facility] provider. Will rx prednisone/zantac/benadryl for her current rash/hives which seems most likely an allergic reaction to keflex. No s/sx of SJS, no angioedema or airway compromise, I feel she  can safely be discharged home with close f/up with [facility] in 2 days for recheck of wound/RLE cellulitis, and with [facility] in 1wk for ongoing medical care and recheck after her allergic rxn/rash. Facial swelling does appear improved. Strict return precautions discussed. Additionally, HTN noted, discussed low-salt diet and [facility] f/up for ongoing checks/management. I explained the diagnosis and have given explicit precautions to return to the ER including for any other new or worsening symptoms. The patient understands and accepts the medical plan as it's been dictated and I have answered their questions. Discharge instructions concerning home care and prescriptions have been given. The patient is STABLE and is discharged to home in good condition.   BP 151/84 mmHg  Pulse 83  Temp(Src) 98.4 F (36.9 C) (Oral)  Resp 18  SpO2 100%  LMP 08/31/2015  Meds ordered this encounter  Medications  . predniSONE (DELTASONE) tablet 60 mg    Sig:   . famotidine (PEPCID) tablet 20 mg    Sig:   . diphenhydrAMINE (BENADRYL) capsule 25 mg    Sig:   . predniSONE (DELTASONE) 20 MG tablet    Sig: 3 tabs po daily x 3 days starting on 09/19/15    Dispense:  9 tablet    Refill:  0    Order Specific Question:  Supervising Provider    Answer:  Eber Hong [[id number]]  . ranitidine (ZANTAC) 150 MG tablet    Sig: Take 1 tablet (150 mg total) by mouth 2 (two) times daily. Take until the rash/swelling resolves    Dispense:  30 tablet    Refill:  0    Order Specific Question:  Supervising Provider    Answer:  MILLER, BRIAN [[id number]]  . diphenhydrAMINE (BENADRYL) 25 MG tablet    Sig: Take 2 tablets (50 mg total) by mouth every 4 (four) hours as needed for itching or allergies.    Dispense:  60 tablet    Refill:  0    Order Specific Question:  Supervising Provider    Answer:  Eber Hong [[id number]]     Latayna Ritchie Camprubi-Soms, PA-C 09/18/15 1859  Raeford Razor, MD 09/20/15 1141

## 2015-09-18 NOTE — ED Notes (Signed)
Patient transported to Ultrasound 

## 2015-09-19 ENCOUNTER — Encounter: Payer: Self-pay | Admitting: Family Medicine

## 2015-09-19 DIAGNOSIS — I8393 Asymptomatic varicose veins of bilateral lower extremities: Secondary | ICD-10-CM

## 2015-09-19 DIAGNOSIS — I83899 Varicose veins of unspecified lower extremities with other complications: Secondary | ICD-10-CM | POA: Insufficient documentation

## 2015-09-19 HISTORY — DX: Asymptomatic varicose veins of bilateral lower extremities: I83.93

## 2015-09-28 ENCOUNTER — Encounter (HOSPITAL_COMMUNITY): Payer: Self-pay | Admitting: *Deleted

## 2015-09-28 ENCOUNTER — Emergency Department (HOSPITAL_COMMUNITY)
Admission: EM | Admit: 2015-09-28 | Discharge: 2015-09-28 | Disposition: A | Discharge status: Home / Self Care | Payer: No Typology Code available for payment source | Attending: Emergency Medicine | Admitting: Emergency Medicine

## 2015-09-28 DIAGNOSIS — L568 Other specified acute skin changes due to ultraviolet radiation: Secondary | ICD-10-CM

## 2015-09-28 DIAGNOSIS — T7840XA Allergy, unspecified, initial encounter: Secondary | ICD-10-CM | POA: Insufficient documentation

## 2015-09-28 DIAGNOSIS — R21 Rash and other nonspecific skin eruption: Secondary | ICD-10-CM

## 2015-09-28 DIAGNOSIS — X32XXXA Exposure to sunlight, initial encounter: Secondary | ICD-10-CM | POA: Insufficient documentation

## 2015-09-28 LAB — COMPREHENSIVE METABOLIC PANEL WITH GFR
ALT: 14 U/L (ref 14–54)
AST: 27 U/L (ref 15–41)
Albumin: 3.7 g/dL (ref 3.5–5.0)
Alkaline Phosphatase: 64 U/L (ref 38–126)
Anion gap: 8 (ref 5–15)
BUN: 7 mg/dL (ref 6–20)
CO2: 23 mmol/L (ref 22–32)
Calcium: 9 mg/dL (ref 8.9–10.3)
Chloride: 105 mmol/L (ref 101–111)
Creatinine, Ser: 0.77 mg/dL (ref 0.44–1.00)
GFR calc Af Amer: >60 mL/min
GFR calc non Af Amer: >60 mL/min
Glucose, Bld: 91 mg/dL (ref 65–99)
Potassium: 3.8 mmol/L (ref 3.5–5.1)
Sodium: 136 mmol/L (ref 135–145)
Total Bilirubin: 0.4 mg/dL (ref 0.3–1.2)
Total Protein: 7.2 g/dL (ref 6.5–8.1)

## 2015-09-28 LAB — CBC WITH DIFFERENTIAL/PLATELET
Basophils Absolute: 0 K/uL (ref 0.0–0.1)
Basophils Relative: 1 %
Eosinophils Absolute: 0.4 K/uL (ref 0.0–0.7)
Eosinophils Relative: 6 %
HCT: 37.6 % (ref 36.0–46.0)
Hemoglobin: 11.9 g/dL — ABNORMAL LOW (ref 12.0–15.0)
Lymphocytes Relative: 36 %
Lymphs Abs: 2.1 K/uL (ref 0.7–4.0)
MCH: 28.3 pg (ref 26.0–34.0)
MCHC: 31.6 g/dL (ref 30.0–36.0)
MCV: 89.3 fL (ref 78.0–100.0)
Monocytes Absolute: 0.8 K/uL (ref 0.1–1.0)
Monocytes Relative: 14 %
Neutro Abs: 2.5 K/uL (ref 1.7–7.7)
Neutrophils Relative %: 43 %
Platelets: 232 K/uL (ref 150–400)
RBC: 4.21 MIL/uL (ref 3.87–5.11)
RDW: 13.6 % (ref 11.5–15.5)
WBC: 5.8 K/uL (ref 4.0–10.5)

## 2015-09-28 LAB — URINALYSIS, ROUTINE W REFLEX MICROSCOPIC
Bilirubin Urine: NEGATIVE
Glucose, UA: NEGATIVE mg/dL
Hgb urine dipstick: NEGATIVE
Ketones, ur: NEGATIVE mg/dL
Leukocytes, UA: NEGATIVE
Nitrite: NEGATIVE
Protein, ur: NEGATIVE mg/dL
Specific Gravity, Urine: 1.005 (ref 1.005–1.030)
pH: 6.5 (ref 5.0–8.0)

## 2015-09-28 LAB — RAPID HIV SCREEN (HIV 1/2 AB+AG)
HIV 1/2 ANTIBODIES: NONREACTIVE
HIV-1 P24 ANTIGEN - HIV24: NONREACTIVE

## 2015-09-28 LAB — RPR: RPR: NONREACTIVE

## 2015-09-28 MED ORDER — FAMOTIDINE IN NACL 20-0.9 MG/50ML-% IV SOLN
20.0000 mg | Freq: Once | INTRAVENOUS | Status: AC
  Administered 2015-09-28: 20 mg via INTRAVENOUS
  Filled 2015-09-28: qty 50

## 2015-09-28 MED ORDER — HYDROXYZINE HCL 25 MG PO TABS: 25.0000 mg | ORAL_TABLET | Freq: Four times a day (QID) | ORAL | Status: DC

## 2015-09-28 MED ORDER — METHYLPREDNISOLONE SODIUM SUCC 125 MG IJ SOLR
125.0000 mg | Freq: Once | INTRAMUSCULAR | Status: AC
  Administered 2015-09-28: 125 mg via INTRAVENOUS
  Filled 2015-09-28: qty 2

## 2015-09-28 MED ORDER — KETOROLAC TROMETHAMINE 30 MG/ML IJ SOLN
30.0000 mg | Freq: Once | INTRAMUSCULAR | Status: AC
  Administered 2015-09-28: 30 mg via INTRAVENOUS
  Filled 2015-09-28: qty 1

## 2015-09-28 MED ORDER — HYDROCORTISONE 1 % EX CREA: TOPICAL_CREAM | CUTANEOUS | Status: DC

## 2015-09-28 MED ORDER — PREDNISONE 10 MG PO TABS: 60.0000 mg | ORAL_TABLET | Freq: Every day | ORAL | Status: DC

## 2015-09-28 MED ORDER — DIPHENHYDRAMINE HCL 50 MG/ML IJ SOLN
12.5000 mg | Freq: Once | INTRAMUSCULAR | Status: AC
  Administered 2015-09-28: 12.5 mg via INTRAVENOUS
  Filled 2015-09-28: qty 1

## 2015-09-28 MED ORDER — HYDROXYZINE HCL 25 MG PO TABS
25.0000 mg | ORAL_TABLET | Freq: Once | ORAL | Status: AC
  Administered 2015-09-28: 25 mg via ORAL
  Filled 2015-09-28: qty 1

## 2015-09-28 NOTE — Discharge Instructions (Signed)
Hives Hives are itchy, red, swollen areas of the skin. They can vary in size and location on your body. Hives can come and go for hours or several days (acute hives) or for several weeks (chronic hives). Hives do not spread from person to person (noncontagious). They may get worse with scratching, exercise, and emotional stress. CAUSES   Allergic reaction to food, additives, or drugs.  Infections, including the common cold.  Illness, such as vasculitis, lupus, or thyroid disease.  Exposure to sunlight, heat, or cold.  Exercise.  Stress.  Contact with chemicals. SYMPTOMS   Red or white swollen patches on the skin. The patches may change size, shape, and location quickly and repeatedly.  Itching.  Swelling of the hands, feet, and face. This may occur if hives develop deeper in the skin. DIAGNOSIS  Your caregiver can usually tell what is wrong by performing a physical exam. Skin or blood tests may also be done to determine the cause of your hives. In some cases, the cause cannot be determined. TREATMENT  Mild cases usually get better with medicines such as antihistamines. Severe cases may require an emergency epinephrine injection. If the cause of your hives is known, treatment includes avoiding that trigger.  HOME CARE INSTRUCTIONS   Avoid causes that trigger your hives.  Take antihistamines as directed by your caregiver to reduce the severity of your hives. Non-sedating or low-sedating antihistamines are usually recommended. Do not drive while taking an antihistamine.  Take any other medicines prescribed for itching as directed by your caregiver.  Wear loose-fitting clothing.  Keep all follow-up appointments as directed by your caregiver. SEEK MEDICAL CARE IF:   You have persistent or severe itching that is not relieved with medicine.  You have painful or swollen joints. SEEK IMMEDIATE MEDICAL CARE IF:   You have a fever.  Your tongue or lips are swollen.  You have  trouble breathing or swallowing.  You feel tightness in the throat or chest.  You have abdominal pain. These problems may be the first sign of a life-threatening allergic reaction. Call your local emergency services (911 in [place] MAKE SURE YOU:   Understand these instructions.  Will watch your condition.  Will get help right away if you are not doing well or get worse.   This information is not intended to replace advice given to you by your health care provider. Make sure you discuss any questions you have with your health care provider.   You have an appointment with primary care on 11/07/15. You may call their office to get on their cancellation list for a sooner appointment. Avoid sun and chemical exposure as much as possible. Apply hydrocortisone cream to skin daily or as need for itchiness. Avoid scratching skin. Take prednisone as prescribed. Take home Ranitidine daily. Take Atarax as needed for itch. Wear compression stocking daily and elevate your legs as much as possible. Return to the ED if you experience fevers, worsening of your rash, lip or tongue swelling, difficulty breathing or swallowing.

## 2015-09-28 NOTE — ED Notes (Signed)
Pt reports itching & rash after using keflex x 1.5 wks ago, pt reports taking abx for cellulitis, pt reports taking Doxycycline & has completed that, pt reports taking Prednisone, Ranitidine, & Benadryl for symptoms, pt reports worsening itching  & facial swelling last night, pt has hives on bil arms & on the neck, pt denies SOB, pt airway intact

## 2015-09-28 NOTE — ED Provider Notes (Signed)
CSN: [id number]     Arrival date & time 09/28/15  0725 History   First MD Initiated Contact with Patient 09/28/15 [phone]     Chief Complaint  Patient presents with  . Allergic Reaction     (Consider location/radiation/quality/duration/timing/severity/associated sxs/prior Treatment) HPI   [name] is a 40 y.o F with no significant pmhx who presents to the ED today c/o skin allergic reaction. Pt states that 2 weeks ago she developed cellulitis in her RLE. Pt states that she initially though it was a patch of eczema on her leg so she furiously scratched the area and it developed into an infection. Pt also had mild itching on her left wrist, but no obvious rash. [facility] for her cellulitis and was placed on Keflex. Pt states that she Took 1 dose of the Keflex and that night developed a full body rash with significant swelling around her eyes and along her neck. Patient then presented to the ED for further evaluation. Patient did not believe that the Keflex was the cause of her rash however, she was switched to doxycycline which she took for 1 week. She was also given 3 days worth of prednisone, ranitidine and cetirizine. Patient states that the infection on her leg drastically improved. She also noticed that the rash was improving and was very mild. Last night patient developed severe worsening of her rash all along her bilateral upper extremities, bilateral lower extremities, face and neck. This came to days after she completed her course of doxycycline. Patient states that she feels that the rash is only located in sun exposed areas. 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 She states that 5 days prior to the initial start of the rash she had dyed her own hair multiple times. She denies any new exposures to lotions, detergents, perfumes. No fevers, chills, lip or tongue swelling, difficulty breathing.   History reviewed. No pertinent past medical  history. History reviewed. No pertinent past surgical history. No family history on file. Social History  Substance Use Topics  . Smoking status: Never Smoker   . Smokeless tobacco: None  . Alcohol Use: No   OB History    No data available     Review of Systems  All other systems reviewed and are negative.     Allergies  Keflex  Home Medications   Prior to Admission medications   Medication Sig Start Date End Date Taking? Authorizing Provider  cephALEXin (KEFLEX) 500 MG capsule Take 1 capsule (500 mg total) by mouth 4 (four) times daily. 09/15/15   Emi Belfasteborah B Gessner, FNP  diphenhydrAMINE (BENADRYL) 25 MG tablet Take 2 tablets (50 mg total) by mouth every 4 (four) hours as needed for itching or allergies. 09/18/15   Mercedes Camprubi-Soms, PA-C  doxycycline (VIBRAMYCIN) 100 MG capsule Take 1 capsule (100 mg total) by mouth 2 (two) times daily. 09/17/15   Emi Belfasteborah B Gessner, FNP  predniSONE (DELTASONE) 20 MG tablet 3 tabs po daily x 3 days starting on 09/19/15 09/19/15   Mercedes Camprubi-Soms, PA-C  ranitidine (ZANTAC) 150 MG tablet Take 1 tablet (150 mg total) by mouth 2 (two) times daily. Take until the rash/swelling resolves 09/18/15   Mercedes Camprubi-Soms, PA-C   Pulse 90  Temp(Src) 98.3 F (36.8 C) (Oral)  Resp 14  Ht 5' 0.5" (1.537 m)  Wt 59.421 kg  BMI 25.15 kg/m2  SpO2 100%  LMP 09/16/2015 Physical Exam  Constitutional: She is oriented  to person, place, and time. She appears well-developed and well-nourished. No distress.  HENT:  Head: Normocephalic and atraumatic.  Mouth/Throat: No oropharyngeal exudate.  No lip or tongue swelling  Eyes: Conjunctivae and EOM are normal. Pupils are equal, round, and reactive to light. Right eye exhibits no discharge. Left eye exhibits no discharge. No scleral icterus.  Cardiovascular: Normal rate, regular rhythm, normal heart sounds and intact distal pulses.  Exam reveals no gallop and no friction rub.   No murmur  heard. Pulmonary/Chest: Effort normal and breath sounds normal. No respiratory distress. She has no wheezes. She has no rales. She exhibits no tenderness.  Abdominal: Soft.  Musculoskeletal: Normal range of motion. She exhibits edema ( 3+ RLE pitting edema, 1+ on LLE). She exhibits no tenderness.  Neurological: She is alert and oriented to person, place, and time.  Skin: Skin is warm and dry. Rash noted. She is not diaphoretic. There is erythema. No pallor.  Significant erythematous and papular rash over face, circumferential neck, bilateral forearms, RLE below knee.   Psychiatric: She has a normal mood and affect. Her behavior is normal.  Nursing note and vitals reviewed.   ED Course  Procedures (including critical care time) Labs Review Labs Reviewed  CBC WITH DIFFERENTIAL/PLATELET - Abnormal; Notable for the following:    Hemoglobin 11.9 (*)    All other components within normal limits  COMPREHENSIVE METABOLIC PANEL  URINALYSIS, ROUTINE W REFLEX MICROSCOPIC (NOT AT [facility])  RAPID HIV SCREEN (HIV 1/2 AB+AG)  RPR    Imaging Review No results found. I have personally reviewed and evaluated these images and lab results as part of my medical decision-making.   EKG Interpretation None      MDM   Final diagnoses:  Rash  Photosensitivity dermatitis   40 y.o F with no significant pmhx presents to the ED today c/o worsening rash to face, neck , bilateral UE and RLE. Rash was present 1 week ago and pt has since taken doxycycline for RLE cellulitis. Suspect rash has worsened secondary to increase photosensitivity from doxy. Rash is only in sun exposed areas. 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 This may also be contributing to rash.Pt was given IV solumedrol, pepcid and benadryl in ED with significant symptomatic improvement. She was also given additional toradol for pain and a dose of oral atarax. NO lip or tongue swelling. No airway compromise. Pt also has  3+ pitting edema in RLE which she states is a chronic finding. She had a negative DVT study done 10 days ago, will not repeat. All lab work wnl. HIV and RPR added on as pt reports possible exposure, doubt this is contributing to rash. Rapid HIV negative.  Pt monitored in the ED for 3 hours. Rash improved. Feel that she is safe for discharge with PCP follow up. Case management consulted pt and arranged for PCP appointment. Will d/c home with prednisone, topical hydrocortisone and atarax. Pt may need referral to dermatology but this will need to be performed by PCp as she currently is uninsured. Discussed treatment plan wit pt who is agreeable. Return precautions outlined in patient discharge instructions.   Patient was discussed with and seen by Dr. Manus Gunningancour who agrees with the treatment plan.       Lester KinsmanSamantha Tripp [place] Heights[place], PA-C 09/28/15 1139  Glynn OctaveStephen Rancour, MD 09/28/15 2135

## 2015-10-08 ENCOUNTER — Ambulatory Visit (INDEPENDENT_AMBULATORY_CARE_PROVIDER_SITE_OTHER): Payer: Self-pay | Admitting: Family Medicine

## 2015-10-08 VITALS — BP 108/62 | HR 115 | Temp 98.4°F | Resp 18 | Ht 66.0 in | Wt 133.4 lb

## 2015-10-08 DIAGNOSIS — I872 Venous insufficiency (chronic) (peripheral): Secondary | ICD-10-CM

## 2015-10-08 DIAGNOSIS — I8311 Varicose veins of right lower extremity with inflammation: Secondary | ICD-10-CM

## 2015-10-08 DIAGNOSIS — L245 Irritant contact dermatitis due to other chemical products: Secondary | ICD-10-CM | POA: Insufficient documentation

## 2015-10-08 HISTORY — DX: Venous insufficiency (chronic) (peripheral): I87.2

## 2015-10-08 MED ORDER — PREDNISONE 5 MG (48) PO TBPK: ORAL_TABLET | ORAL | Status: DC

## 2015-10-08 MED ORDER — TRIAMCINOLONE ACETONIDE 0.1 % EX CREA: 1.0000 "application " | TOPICAL_CREAM | Freq: Two times a day (BID) | CUTANEOUS | Status: DC

## 2015-10-08 MED ORDER — TRIAMCINOLONE ACETONIDE 0.5 % EX OINT: 1.0000 "application " | TOPICAL_OINTMENT | Freq: Two times a day (BID) | CUTANEOUS | Status: DC

## 2015-10-08 NOTE — Patient Instructions (Addendum)
Thank you for coming in today. Take prednisone daily.  Use compression stockings.  Use the tub of cream for most of the rash.  Use the tube of the cream for the hot spots.   Return as needed.   Contact Dermatitis Dermatitis is redness, soreness, and swelling (inflammation) of the skin. Contact dermatitis is a reaction to certain substances that touch the skin. There are two types of contact dermatitis:   Irritant contact dermatitis. This type is caused by something that irritates your skin, such as dry hands from washing them too much. This type does not require previous exposure to the substance for a reaction to occur. This type is more common.  Allergic contact dermatitis. This type is caused by a substance that you are allergic to, such as a nickel allergy or poison ivy. This type only occurs if you have been exposed to the substance (allergen) before. Upon a repeat exposure, your body reacts to the substance. This type is less common. CAUSES  Many different substances can cause contact dermatitis. Irritant contact dermatitis is most commonly caused by exposure to:   Makeup.   Soaps.   Detergents.   Bleaches.   Acids.   Metal salts, such as nickel.  Allergic contact dermatitis is most commonly caused by exposure to:   Poisonous plants.   Chemicals.   Jewelry.   Latex.   Medicines.   Preservatives in products, such as clothing.  RISK FACTORS This condition is more likely to develop in:   People who have jobs that expose them to irritants or allergens.  People who have certain medical conditions, such as asthma or eczema.  SYMPTOMS  Symptoms of this condition may occur anywhere on your body where the irritant has touched you or is touched by you. Symptoms include:  Dryness or flaking.   Redness.   Cracks.   Itching.   Pain or a burning feeling.   Blisters.  Drainage of small amounts of blood or clear fluid from skin cracks. With  allergic contact dermatitis, there may also be swelling in areas such as the eyelids, mouth, or genitals.  DIAGNOSIS  This condition is diagnosed with a medical history and physical exam. A patch skin test may be performed to help determine the cause. If the condition is related to your job, you may need to see an occupational medicine specialist. TREATMENT Treatment for this condition includes figuring out what caused the reaction and protecting your skin from further contact. Treatment may also include:   Steroid creams or ointments. Oral steroid medicines may be needed in more severe cases.  Antibiotics or antibacterial ointments, if a skin infection is present.  Antihistamine lotion or an antihistamine taken by mouth to ease itching.  A bandage (dressing). HOME CARE INSTRUCTIONS Skin Care  Moisturize your skin as needed.   Apply cool compresses to the affected areas.  Try taking a bath with:  Epsom salts. Follow the instructions on the packaging. You can get these at your local pharmacy or grocery store.  Baking soda. Pour a small amount into the bath as directed by your health care provider.  Colloidal oatmeal. Follow the instructions on the packaging. You can get this at your local pharmacy or grocery store.  Try applying baking soda paste to your skin. Stir water into baking soda until it reaches a paste-like consistency.  Do not scratch your skin.  Bathe less frequently, such as every other day.  Bathe in lukewarm water. Avoid using hot water. Medicines  Take or apply over-the-counter and prescription medicines only as told by your health care provider.   If you were prescribed an antibiotic medicine, take or apply your antibiotic as told by your health care provider. Do not stop using the antibiotic even if your condition starts to improve. General Instructions  Keep all follow-up visits as told by your health care provider. This is important.  Avoid the  substance that caused your reaction. If you do not know what caused it, keep a journal to try to track what caused it. Write down:  What you eat.  What cosmetic products you use.  What you drink.  What you wear in the affected area. This includes jewelry.  If you were given a dressing, take care of it as told by your health care provider. This includes when to change and remove it. SEEK MEDICAL CARE IF:   Your condition does not improve with treatment.  Your condition gets worse.  You have signs of infection such as swelling, tenderness, redness, soreness, or warmth in the affected area.  You have a fever.  You have new symptoms. SEEK IMMEDIATE MEDICAL CARE IF:   You have a severe headache, neck pain, or neck stiffness.  You vomit.  You feel very sleepy.  You notice red streaks coming from the affected area.  Your bone or joint underneath the affected area becomes painful after the skin has healed.  The affected area turns darker.  You have difficulty breathing.   This information is not intended to replace advice given to you by your health care provider. Make sure you discuss any questions you have with your health care provider.   Document Released: 03/09/2000 Document Revised: 12/01/2014 Document Reviewed: 07/28/2014 Elsevier Interactive Patient Education 2016 [employer]  Venous Stasis or Chronic Venous Insufficiency Chronic venous insufficiency, also called venous stasis, is a condition that affects the veins in the legs. The condition prevents blood from being pumped through these veins effectively. Blood may no longer be pumped effectively from the legs back to the heart. This condition can range from mild to severe. With proper treatment, you should be able to continue with an active life. CAUSES  Chronic venous insufficiency occurs when the vein walls become stretched, weakened, or damaged or when valves within the vein are damaged. Some common causes of  this include:  High blood pressure inside the veins (venous hypertension).  Increased blood pressure in the leg veins from long periods of sitting or standing.  A blood clot that blocks blood flow in a vein (deep vein thrombosis).  Inflammation of a superficial vein (phlebitis) that causes a blood clot to form. RISK FACTORS Various things can make you more likely to develop chronic venous insufficiency, including:  Family history of this condition.  Obesity.  Pregnancy.  Sedentary lifestyle.  Smoking.  Jobs requiring long periods of standing or sitting in one place.  Being a certain age. Women in their 3040s and 6150s and men in their 3370s are more likely to develop this condition. SIGNS AND SYMPTOMS  Symptoms may include:   Varicose veins.  Skin breakdown or ulcers.  Reddened or discolored skin on the leg.  Brown, smooth, tight, and painful skin just above the ankle, usually on the inside surface (lipodermatosclerosis).  Swelling. DIAGNOSIS  To diagnose this condition, your health care provider will take a medical history and do a physical exam. The following tests may be ordered to confirm the diagnosis:  Duplex ultrasound--A procedure that produces a  picture of a blood vessel and nearby organs and also provides information on blood flow through the blood vessel.  Plethysmography--A procedure that tests blood flow.  A venogram, or venography--A procedure used to look at the veins using X-ray and dye. TREATMENT The goals of treatment are to help you return to an active life and to minimize pain or disability. Treatment will depend on the severity of the condition. Medical procedures may be needed for severe cases. Treatment options may include:   Use of compression stockings. These can help with symptoms and lower the chances of the problem getting worse, but they do not cure the problem.  Sclerotherapy--A procedure involving an injection of a material that "dissolves"  the damaged veins. Other veins in the network of blood vessels take over the function of the damaged veins.  Surgery to remove the vein or cut off blood flow through the vein (vein stripping or laser ablation surgery).  Surgery to repair a valve. HOME CARE INSTRUCTIONS   Wear compression stockings as directed by your health care provider.  Only take over-the-counter or prescription medicines for pain, discomfort, or fever as directed by your health care provider.  Follow up with your health care provider as directed. SEEK MEDICAL CARE IF:   You have redness, swelling, or increasing pain in the affected area.  You see a red streak or line that extends up or down from the affected area.  You have a breakdown or loss of skin in the affected area, even if the breakdown is small.  You have an injury to the affected area. SEEK IMMEDIATE MEDICAL CARE IF:   You have an injury and open wound in the affected area.  Your pain is severe and does not improve with medicine.  You have sudden numbness or weakness in the foot or ankle below the affected area, or you have trouble moving your foot or ankle.  You have a fever or persistent symptoms for more than 2-3 days.  You have a fever and your symptoms suddenly get worse. MAKE SURE YOU:   Understand these instructions.  Will watch your condition.  Will get help right away if you are not doing well or get worse.   This information is not intended to replace advice given to you by your health care provider. Make sure you discuss any questions you have with your health care provider.   Document Released: 07/16/2006 Document Revised: 12/31/2012 Document Reviewed: 11/17/2012 Elsevier Interactive Patient Education 2016 [employer].   IF you received an x-ray today, you will receive an invoice from [facility]. Please contact [facility] at [phone] with questions or concerns regarding your invoice.   IF you received  labwork today, you will receive an invoice from [employer]. Please contact [facility] at [phone] with questions or concerns regarding your invoice.   Our billing staff will not be able to assist you with questions regarding bills from these companies.  You will be contacted with the lab results as soon as they are available. The fastest way to get your results is to activate your My Chart account. Instructions are located on the last page of this paperwork. If you have not heard from [place] regarding the results in 2 weeks, please contact this office.

## 2015-10-08 NOTE — Progress Notes (Signed)
[name] is a 40 y.o. female who presents to [facility] today for rash and right leg swelling.  Patient has been seen several times in the emergency room and urgent care for rash across her upper extremities and chest. This was thought to be due to either photosensitivity due to doxycycline or a contact irritant dermatitis due to chemicals with hair dye. She was given a short course of prednisone which resolved her symptoms. However with a prednisone course finished the rash returned. She notes it's very itchy area and she's been using over-the-counter hydrocortisone cream which does not help at all as well as over-the-counter Benadryl which does help temporarily. She denies any fevers or chills nausea vomiting diarrhea chest pains or palpitations.  Additionally she notes continued right leg swelling. She has a history of right leg swelling in the past that has been worked up and is thought to be due to venous insufficiency. She had a negative duplex ultrasound on 09/18/2015. The venous insufficiency and right leg swelling ultimately caused venous stasis dermatitis and cellulitis. She notes the leg is not painful but continues to swell. She's uses a compression stocking that seems to help.   History reviewed. No pertinent past medical history. History reviewed. No pertinent past surgical history. Social History  Substance Use Topics  . Smoking status: Never Smoker   . Smokeless tobacco: Not on file  . Alcohol Use: No   ROS as above Medications: Current Outpatient Prescriptions  Medication Sig Dispense Refill  . hydrocortisone cream 1 % Apply to affected area 2 times daily 15 g 0  . hydrOXYzine (ATARAX/VISTARIL) 25 MG tablet Take 1 tablet (25 mg total) by mouth every 6 (six) hours. 12 tablet 0  . ibuprofen (ADVIL,MOTRIN) 200 MG tablet Take 600 mg by mouth every 6 (six) hours as needed for moderate pain.    . ranitidine (ZANTAC) 150 MG tablet Take 1 tablet (150 mg total) by mouth 2 (two)  times daily. Take until the rash/swelling resolves 30 tablet 0  . diphenhydrAMINE (BENADRYL) 25 MG tablet Take 2 tablets (50 mg total) by mouth every 4 (four) hours as needed for itching or allergies. (Patient not taking: Reported on 10/08/2015) 60 tablet 0  . predniSONE (STERAPRED UNI-PAK 48 TAB) 5 MG (48) TBPK tablet 12 day dosepack po 48 tablet 0  . triamcinolone cream (KENALOG) 0.1 % Apply 1 application topically 2 (two) times daily. 453.6 g 3  . triamcinolone ointment (KENALOG) 0.5 % Apply 1 application topically 2 (two) times daily. To affected area, avoid eyes and face 30 g 3   No current facility-administered medications for this visit.   Allergies  Allergen Reactions  . Keflex [Cephalexin] Hives     Exam:  BP 108/62 mmHg  Pulse 115  Temp(Src) 98.4 F (36.9 C) (Oral)  Resp 18  Ht  (1.676 m)  Wt 133 lb 6.4 oz (60.51 kg)  BMI 21.54 kg/m2  SpO2 100%  LMP 09/16/2015 Gen: Well NAD HEENT: EOMI,  MMM Lungs: Normal work of breathing. CTABL Heart: RRR no MRG Abd: NABS, Soft. Nondistended, Nontender Exts: Brisk capillary refill, warm and well perfused. Left leg. Right leg 2+ edema to knee. Some fluid oozing. No skin erythema or tenderness. No induration present. Skin: Maculopapular erythematous rash on arms and chest and face. No tongue or lip involvement. No eye involvement. No tongue or lip swelling or trouble breathing.   No results found for this or any previous visit (from the past 24 hour(s)).  No results found.  Assessment and Plan: 40 y.o. female with  1) skin rash: I suspect this is irritant or contact dermatitis due to hair dye and not photosensitivity due to doxycycline. However it could be a possibility. Plan to redo a prednisone course overall extent of for longer than a few days. Wheezing 12 day prednisone Dosepak. Additionally we'll use topical triamcinolone cream and ointment. Continue Benadryl.  2) venous stasis dermatitis: Continue compression stockings.  Follow-up with PCP.  Discussed warning signs or symptoms. Please see discharge instructions. Patient expresses understanding.

## 2015-11-07 ENCOUNTER — Encounter: Payer: Self-pay | Admitting: Family Medicine

## 2015-11-07 ENCOUNTER — Ambulatory Visit (INDEPENDENT_AMBULATORY_CARE_PROVIDER_SITE_OTHER): Payer: Self-pay | Admitting: Family Medicine

## 2015-11-07 VITALS — BP 140/85 | HR 90 | Temp 98.5°F | Ht 65.0 in | Wt 136.0 lb

## 2015-11-07 DIAGNOSIS — L299 Pruritus, unspecified: Secondary | ICD-10-CM

## 2015-11-07 DIAGNOSIS — Z7189 Other specified counseling: Secondary | ICD-10-CM

## 2015-11-07 DIAGNOSIS — Z7689 Persons encountering health services in other specified circumstances: Secondary | ICD-10-CM

## 2015-11-07 DIAGNOSIS — Z0000 Encounter for general adult medical examination without abnormal findings: Secondary | ICD-10-CM

## 2015-11-07 NOTE — Patient Instructions (Signed)
Call and make an appointment for a PAP and follow-up visit when you get your orange card.

## 2015-11-07 NOTE — Progress Notes (Signed)
[name], is a 40 y.o. female  ZOX:[id number]  [id number]  DOB - 10/15/1975  CC:  Chief Complaint  Patient presents with  . New Patient (Initial Visit)    needs to get established trying to obtain orange card, stands all the time in occupation, gets some swelling in legs and recently developed a sore on lower leg that became infected and also had a reaction to keflex and or hair dye , still having some generalized itching and is using Zyrtec       HPI: Alison NettersDana Donati is a 40 y.o. female here to establish care. She was seen in ED recently for a sore on her right lower leg probably related to venous stasis. She was referred her to establish primary care. She is currently in the process of applying for an orange card. She would like to delay any health maintenance items until that is accomplished. She has been experiencing some itching presumedly related to Keflex. She has complete a round of prednisone for the same. She continues to have some itching, particularly at night. She is taking Zyrtec daily, which she reports does not help. She is in need of tetanus, PAP, mammogram, and Lipid screening.  Allergies  Allergen Reactions  . Keflex [Cephalexin] Hives   History reviewed. No pertinent past medical history. Current Outpatient Prescriptions on File Prior to Visit  Medication Sig Dispense Refill  . triamcinolone ointment (KENALOG) 0.5 % Apply 1 application topically 2 (two) times daily. To affected area, avoid eyes and face 30 g 3  . diphenhydrAMINE (BENADRYL) 25 MG tablet Take 2 tablets (50 mg total) by mouth every 4 (four) hours as needed for itching or allergies. (Patient not taking: Reported on 10/08/2015) 60 tablet 0  . hydrocortisone cream 1 % Apply to affected area 2 times daily (Patient not taking: Reported on 11/07/2015) 15 g 0  . hydrOXYzine (ATARAX/VISTARIL) 25 MG tablet Take 1 tablet (25 mg total) by mouth every 6 (six) hours. (Patient not taking: Reported on 11/07/2015) 12  tablet 0  . ibuprofen (ADVIL,MOTRIN) 200 MG tablet Take 600 mg by mouth every 6 (six) hours as needed for moderate pain.    . predniSONE (STERAPRED UNI-PAK 48 TAB) 5 MG (48) TBPK tablet 12 day dosepack po (Patient not taking: Reported on 11/07/2015) 48 tablet 0  . ranitidine (ZANTAC) 150 MG tablet Take 1 tablet (150 mg total) by mouth 2 (two) times daily. Take until the rash/swelling resolves (Patient not taking: Reported on 11/07/2015) 30 tablet 0  . triamcinolone cream (KENALOG) 0.1 % Apply 1 application topically 2 (two) times daily. (Patient not taking: Reported on 11/07/2015) 453.6 g 3   No current facility-administered medications on file prior to visit.    Family History  Problem Relation Age of Onset  . Hypertension Mother    Social History   Social History  . Marital status: Single    Spouse name: N/A  . Number of children: N/A  . Years of education: N/A   Occupational History  . Not on file.   Social History Main Topics  . Smoking status: Never Smoker  . Smokeless tobacco: Never Used  . Alcohol use No  . Drug use: No  . Sexual activity: Not Currently   Other Topics Concern  . Not on file   Social History Narrative  . No narrative on file    Review of Systems: Constitutional: Negative for fever, chills, appetite change, weight loss,  Fatigue. Skin: Positive for intermittent rash on skin  with itching HENT: Negative for ear pain, ear discharge.nose bleeds Eyes: Negative for pain, discharge, redness, itching and visual disturbance. Neck: Negative for pain, stiffness Respiratory: Negative for cough, shortness of breath,   Cardiovascular: Negative for chest pain, palpitations. Positive for some leg swelling and varicose veins. Gastrointestinal: Negative for abdominal pain, nausea, vomiting, diarrhea, constipations Genitourinary: Negative for dysuria, urgency, frequency, hematuria,  Musculoskeletal: Negative for back pain, joint pain, joint  swelling, and gait  problem.Negative for weakness. Neurological: Negative for dizziness, tremors, seizures, syncope,   light-headedness, numbness and headaches.  Hematological: Negative for easy bruising or bleeding Psychiatric/Behavioral: Negative for depression, anxiety, decreased concentration, confusion   Objective:   Vitals:   11/07/15 0815  BP: 140/85  Pulse: 90  Temp: 98.5 F (36.9 C)    Physical Exam: Constitutional: Patient appears well-developed and well-nourished. No distress. HENT: Normocephalic, atraumatic, External right and left ear normal. Oropharynx is clear and moist.  Eyes: Conjunctivae and EOM are normal. PERRLA, no scleral icterus. Neck: Normal ROM. Neck supple. No lymphadenopathy, No thyromegaly. CVS: RRR, S1/S2 +, no murmurs, no gallops, no rubs. She has multiple varicosities R>L. She is wearing compression hose. Pulmonary: Effort and breath sounds normal, no stridor, rhonchi, wheezes, rales.  Abdominal: Soft. Normoactive BS,, no distension, tenderness, rebound or guarding.  Musculoskeletal: Normal range of motion. No edema and no tenderness.  Neuro: Alert.Normal muscle tone coordination. Non-focal Skin: Skin is warm and dry. No rash noted. Not diaphoretic. No erythema. No pallor. Psychiatric: Normal mood and affect. Behavior, judgment, thought content normal.  Lab Results  Component Value Date   WBC 5.8 09/28/2015   HGB 11.9 (L) 09/28/2015   HCT 37.6 09/28/2015   MCV 89.3 09/28/2015   PLT 232 09/28/2015   Lab Results  Component Value Date   CREATININE 0.77 09/28/2015   BUN 7 09/28/2015   NA 136 09/28/2015   K 3.8 09/28/2015   CL 105 09/28/2015   CO2 23 09/28/2015    No results found for: HGBA1C Lipid Panel  No results found for: CHOL, TRIG, HDL, CHOLHDL, VLDL, LDLCALC     Assessment and plan:   1. Encounter to establish care -I have reviewed information presented by the patient and pertinent information in her chart.  2. Itching -Recommended Benadryl at  [facility] and good moisturizer.  3. Healthcare maintenance - Have discussed and she will return when she has her orange card.   Return in about 3 months (around 02/07/2016).  The patient was given clear instructions to go to ER or return to medical center if symptoms don't improve, worsen or new problems develop. The patient verbalized understanding.    Henrietta HooverLinda C Bernhardt FNP  11/07/2015, 8:57 AM

## 2015-11-07 NOTE — Progress Notes (Signed)
zyter

## 2016-02-07 ENCOUNTER — Ambulatory Visit: Payer: Self-pay | Admitting: Family Medicine

## 2016-04-02 ENCOUNTER — Ambulatory Visit (INDEPENDENT_AMBULATORY_CARE_PROVIDER_SITE_OTHER): Payer: BLUE CROSS/BLUE SHIELD | Admitting: Family Medicine

## 2016-04-02 ENCOUNTER — Other Ambulatory Visit (HOSPITAL_COMMUNITY)
Admission: RE | Admit: 2016-04-02 | Discharge: 2016-04-02 | Disposition: A | Discharge status: Home / Self Care | Payer: BLUE CROSS/BLUE SHIELD | Source: Ambulatory Visit | Attending: Family Medicine | Admitting: Family Medicine

## 2016-04-02 ENCOUNTER — Other Ambulatory Visit: Payer: Self-pay | Admitting: Family Medicine

## 2016-04-02 ENCOUNTER — Encounter: Payer: Self-pay | Admitting: Family Medicine

## 2016-04-02 VITALS — BP 130/80 | HR 74 | Temp 98.0°F | Resp 12 | Ht 64.0 in | Wt 136.0 lb

## 2016-04-02 DIAGNOSIS — Z113 Encounter for screening for infections with a predominantly sexual mode of transmission: Secondary | ICD-10-CM | POA: Insufficient documentation

## 2016-04-02 DIAGNOSIS — Z01419 Encounter for gynecological examination (general) (routine) without abnormal findings: Secondary | ICD-10-CM | POA: Diagnosis not present

## 2016-04-02 DIAGNOSIS — Z23 Encounter for immunization: Secondary | ICD-10-CM | POA: Diagnosis not present

## 2016-04-02 DIAGNOSIS — Z1151 Encounter for screening for human papillomavirus (HPV): Secondary | ICD-10-CM | POA: Insufficient documentation

## 2016-04-02 DIAGNOSIS — Z0000 Encounter for general adult medical examination without abnormal findings: Secondary | ICD-10-CM

## 2016-04-02 LAB — COMPLETE METABOLIC PANEL WITH GFR
ALT: 11 U/L (ref 6–29)
AST: 20 U/L (ref 10–30)
Albumin: 4.1 g/dL (ref 3.6–5.1)
Alkaline Phosphatase: 61 U/L (ref 33–115)
BILIRUBIN TOTAL: 0.3 mg/dL (ref 0.2–1.2)
BUN: 12 mg/dL (ref 7–25)
CALCIUM: 9.1 mg/dL (ref 8.6–10.2)
CHLORIDE: 102 mmol/L (ref 98–110)
CO2: 26 mmol/L (ref 20–31)
CREATININE: 0.79 mg/dL (ref 0.50–1.10)
GFR, Est African American: >89 mL/min (ref >=60)
GFR, Est Non African American: >89 mL/min (ref >=60)
Glucose, Bld: 86 mg/dL (ref 65–99)
Potassium: 3.5 mmol/L (ref 3.5–5.3)
Sodium: 138 mmol/L (ref 135–146)
TOTAL PROTEIN: 7.6 g/dL (ref 6.1–8.1)

## 2016-04-02 LAB — LIPID PANEL
Cholesterol: 211 mg/dL — ABNORMAL HIGH (ref <200)
HDL: 102 mg/dL (ref >50)
LDL CALC: 97 mg/dL (ref <100)
TRIGLYCERIDES: 59 mg/dL (ref <150)
Total CHOL/HDL Ratio: 2.1 Ratio (ref <5.0)
VLDL: 12 mg/dL (ref <30)

## 2016-04-02 LAB — CBC WITH DIFFERENTIAL/PLATELET
Basophils Absolute: 46 cells/uL (ref 0–200)
Basophils Relative: 1 %
Eosinophils Absolute: 46 cells/uL (ref 15–500)
Eosinophils Relative: 1 %
HEMATOCRIT: 37.5 % (ref 35.0–45.0)
Hemoglobin: 12 g/dL (ref 11.7–15.5)
LYMPHS PCT: 39 %
Lymphs Abs: 1794 cells/uL (ref 850–3900)
MCH: 28.6 pg (ref 27.0–33.0)
MCHC: 32 g/dL (ref 32.0–36.0)
MCV: 89.3 fL (ref 80.0–100.0)
MONO ABS: 460 {cells}/uL (ref 200–950)
MPV: 10.4 fL (ref 7.5–12.5)
Monocytes Relative: 10 %
NEUTROS PCT: 49 %
Neutro Abs: 2254 cells/uL (ref 1500–7800)
Platelets: 234 10*3/uL (ref 140–400)
RBC: 4.2 MIL/uL (ref 3.80–5.10)
RDW: 14 % (ref 11.0–15.0)
WBC: 4.6 10*3/uL (ref 3.8–10.8)

## 2016-04-02 LAB — HEMOGLOBIN A1C
HEMOGLOBIN A1C: 4.8 % (ref <5.7)
Mean Plasma Glucose: 91 mg/dL

## 2016-04-02 NOTE — Patient Instructions (Signed)
Will let you know of any abnormal results on blood or PAP> Try to follow a healthly low fat, low sugar diet and exercise regularly.

## 2016-04-02 NOTE — Progress Notes (Signed)
[name], is a 41 y.o. female  ZOX:[id number]  WJX:[id number]  DOB - 1975-10-14  CC:  Chief Complaint  Patient presents with  . pap smear    and STD testing, and other bloodwork screening has [employer]       HPI: [name] is a 41 y.o. female here for routine health maintenance, including PAP smear. She was seen here earlier this year and health maintenance items were delayed at her request due to lack of insurance.  She now has insurance. She reports doing well. Periods are generally regular. She does feel she has a little more discharge than previously. Would like to be screened for STIs.  She is in need of flu shot as well as Tdap. She does not follow any particular dietary plan and does not exercise regularly. She denies cigarette use, excessive alcohol use and denies drug use. She is on no regular medications.    Allergies  Allergen Reactions  . Keflex [Cephalexin] Hives   History reviewed. No pertinent past medical history. Current Outpatient Prescriptions on File Prior to Visit  Medication Sig Dispense Refill  . cetirizine (ZYRTEC) 10 MG tablet Take 10 mg by mouth daily.    . diphenhydrAMINE (BENADRYL) 25 MG tablet Take 2 tablets (50 mg total) by mouth every 4 (four) hours as needed for itching or allergies. (Patient not taking: Reported on 04/02/2016) 60 tablet 0  . hydrOXYzine (ATARAX/VISTARIL) 25 MG tablet Take 1 tablet (25 mg total) by mouth every 6 (six) hours. (Patient not taking: Reported on 04/02/2016) 12 tablet 0  . ibuprofen (ADVIL,MOTRIN) 200 MG tablet Take 600 mg by mouth every 6 (six) hours as needed for moderate pain.    . ranitidine (ZANTAC) 150 MG tablet Take 1 tablet (150 mg total) by mouth 2 (two) times daily. Take until the rash/swelling resolves (Patient not taking: Reported on 04/02/2016) 30 tablet 0   No current facility-administered medications on file prior to visit.    Family History  Problem Relation Age of Onset  . Hypertension Mother    Social History    Social History  . Marital status: Single    Spouse name: N/A  . Number of children: N/A  . Years of education: N/A   Occupational History  . Not on file.   Social History Main Topics  . Smoking status: Never Smoker  . Smokeless tobacco: Never Used  . Alcohol use No  . Drug use: No  . Sexual activity: Not Currently   Other Topics Concern  . Not on file   Social History Narrative  . No narrative on file    Review of Systems: Constitutional: Negative Skin: Negative HENT: Negative  Eyes: Negative  Neck: Negative Respiratory: Negative Cardiovascular: + for some swelling of feet and legs with prolonged standing Gastrointestinal: Negative Genitourinary: Negative  Musculoskeletal: Negative   Neurological: Negative for Hematological: Negative  Psychiatric/Behavioral: Negative    Objective:   Vitals:   04/02/16 1338  BP: 130/80  Pulse: 74  Resp: 12  Temp: 98 F (36.7 C)    Physical Exam: Constitutional: Patient appears well-developed and well-nourished. No distress. HENT: Normocephalic, atraumatic, External right and left ear normal. Oropharynx is clear and moist.  Eyes: Conjunctivae and EOM are normal. PERRLA, no scleral icterus. Neck: Normal ROM. Neck supple. No lymphadenopathy, No thyromegaly. CVS: RRR, S1/S2 +, no murmurs, no gallops, no rubs Pulmonary: Effort and breath sounds normal, no stridor, rhonchi, wheezes, rales.  Abdominal: Soft. Normoactive BS,, no distension, tenderness, rebound or  guarding.  Musculoskeletal: Normal range of motion. No edema and no tenderness.  Neuro: Alert.Normal muscle tone coordination. Non-focal Skin: Skin is warm and dry. No rash noted. Not diaphoretic. No erythema. No pallor. Psychiatric: Normal mood and affect. Behavior, judgment, thought content normal. GYN:  Normal GYN exam.  Lab Results  Component Value Date   WBC 5.8 09/28/2015   HGB 11.9 (L) 09/28/2015   HCT 37.6 09/28/2015   MCV 89.3 09/28/2015   PLT 232  09/28/2015   Lab Results  Component Value Date   CREATININE 0.77 09/28/2015   BUN 7 09/28/2015   NA 136 09/28/2015   K 3.8 09/28/2015   CL 105 09/28/2015   CO2 23 09/28/2015    No results found for: HGBA1C Lipid Panel  No results found for: CHOL, TRIG, HDL, CHOLHDL, VLDL, LDLCALC      Assessment and plan:   1. Healthcare maintenance  - Hemoglobin A1c - COMPLETE METABOLIC PANEL WITH GFR - CBC with Differential - Lipid panel - Cytology - PAP [place] - MM DIGITAL SCREENING BILATERAL; Future  2. Need for prophylactic vaccination and inoculation against influenza  - Flu Vaccine QUAD 36+ mos PF IM (Fluarix & Fluzone Quad PF)  3. Need for Tdap vaccination  - Tdap vaccine greater than or equal to 7yo IM   Return in about 6 months (around 09/30/2016).  The patient was given clear instructions to go to ER or return to medical center if symptoms don't improve, worsen or new problems develop. The patient verbalized understanding.    Henrietta HooverLinda C Isiac Breighner FNP  04/02/2016, 2:20 PM

## 2016-04-04 ENCOUNTER — Other Ambulatory Visit: Payer: Self-pay | Admitting: Family Medicine

## 2016-04-04 LAB — CYTOLOGY - PAP
BACTERIAL VAGINITIS: POSITIVE — AB
Chlamydia: NEGATIVE
DIAGNOSIS: NEGATIVE
HPV (WINDOPATH): NOT DETECTED
NEISSERIA GONORRHEA: NEGATIVE
Trichomonas: NEGATIVE

## 2016-04-04 MED ORDER — METRONIDAZOLE 500 MG PO TABS: 500.0000 mg | ORAL_TABLET | Freq: Two times a day (BID) | ORAL | 0 refills | Status: DC

## 2016-04-04 MED FILL — ?METRONIDAZOLE 500 MG TABLE: 500 | 7 days supply | Qty: 14 | Fill #0

## 2016-08-30 ENCOUNTER — Other Ambulatory Visit: Payer: Self-pay | Admitting: Family Medicine

## 2016-08-30 DIAGNOSIS — Z1231 Encounter for screening mammogram for malignant neoplasm of breast: Secondary | ICD-10-CM

## 2016-09-12 ENCOUNTER — Ambulatory Visit
Admission: RE | Admit: 2016-09-12 | Discharge: 2016-09-12 | Disposition: A | Discharge status: Home / Self Care | Payer: BLUE CROSS/BLUE SHIELD | Source: Ambulatory Visit | Attending: Family Medicine | Admitting: Family Medicine

## 2016-09-12 DIAGNOSIS — Z1231 Encounter for screening mammogram for malignant neoplasm of breast: Secondary | ICD-10-CM

## 2016-10-01 ENCOUNTER — Ambulatory Visit: Payer: BLUE CROSS/BLUE SHIELD | Admitting: Family Medicine

## 2016-10-08 ENCOUNTER — Encounter: Payer: Self-pay | Admitting: Family Medicine

## 2016-10-08 ENCOUNTER — Ambulatory Visit (INDEPENDENT_AMBULATORY_CARE_PROVIDER_SITE_OTHER): Payer: BLUE CROSS/BLUE SHIELD | Admitting: Family Medicine

## 2016-10-08 VITALS — BP 126/72 | HR 84 | Temp 98.3°F | Resp 12 | Ht 64.0 in | Wt 141.0 lb

## 2016-10-08 DIAGNOSIS — I781 Nevus, non-neoplastic: Secondary | ICD-10-CM

## 2016-10-08 DIAGNOSIS — M7989 Other specified soft tissue disorders: Secondary | ICD-10-CM | POA: Diagnosis not present

## 2016-10-08 DIAGNOSIS — I878 Other specified disorders of veins: Secondary | ICD-10-CM | POA: Diagnosis not present

## 2016-10-08 NOTE — Progress Notes (Signed)
Patient ID: [name], female    DOB: Sep 12, 1975, 41 y.o.   MRN: [id number]  PCP: Bing Neighbors, FNP  Chief Complaint  Patient presents with  . Follow-up    6 MONTH leg swelling     Subjective:  HPI [name] is a 41 y.o. female presents for evaluation of bilateral leg swelling and spider veins.  Medical history significant for chronic lower extremity edema, cellulitis of LLE (1 year ago) and vaginal childbirth only. [name] reports a chronic problem of lower extremity swelling since her late teens. 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 Over the years she has noticed an increase and worsening presentation of her leg veins. She feels that back on her legs venous patterns are worse then the front. Recently she has purchased and worn compression stockings which has not resolved the swelling but has improved the degree of swelling. Denies any prior history or family history of cardiovascular disease. She presents today requesting a referral to vascular surgeon for further evaluation. She had been without health insurance for sometime and now has coverage to have her condition further evaluated. Social History   Social History  . Marital status: Single    Spouse name: N/A  . Number of children: N/A  . Years of education: N/A   Occupational History  . Not on file.   Social History Main Topics  . Smoking status: Never Smoker  . Smokeless tobacco: Never Used  . Alcohol use No  . Drug use: No  . Sexual activity: Not Currently   Other Topics Concern  . Not on file   Social History Narrative  . No narrative on file    Family History  Problem Relation Age of Onset  . Hypertension Mother    Review of Systems See HPI Patient Active Problem List   Diagnosis Date Noted  . Venous stasis dermatitis of right lower extremity 10/08/2015  . Irritant contact dermatitis due to chemical 10/08/2015  . Varicose veins of leg with edema 09/19/2015     Allergies  Allergen Reactions  . Keflex [Cephalexin] Hives    Prior to Admission medications   Medication Sig Start Date End Date Taking? Authorizing Provider  cetirizine (ZYRTEC) 10 MG tablet Take 10 mg by mouth daily.    [provider]  diphenhydrAMINE (BENADRYL) 25 MG tablet Take 2 tablets (50 mg total) by mouth every 4 (four) hours as needed for itching or allergies. Patient not taking: Reported on 04/02/2016 09/18/15   Street, [place], PA-C  hydrOXYzine (ATARAX/VISTARIL) 25 MG tablet Take 1 tablet (25 mg total) by mouth every 6 (six) hours. Patient not taking: Reported on 04/02/2016 09/28/15   Dowless, Lelon Mast Tripp, PA-C  ibuprofen (ADVIL,MOTRIN) 200 MG tablet Take 600 mg by mouth every 6 (six) hours as needed for moderate pain.    [provider]  ranitidine (ZANTAC) 150 MG tablet Take 1 tablet (150 mg total) by mouth 2 (two) times daily. Take until the rash/swelling resolves Patient not taking: Reported on 04/02/2016 09/18/15   Street, [place], PA-C    Past Medical, Surgical Family and Social History reviewed and updated.    Objective:   Today's Vitals   10/08/16 1320  BP: 126/72  Pulse: 84  Resp: 12  Temp: 98.3 F (36.8 C)  TempSrc: Oral  SpO2: 100%  Weight: 141 lb (64 kg)  Height: 5\' 4"  (1.626 m)    Wt Readings from Last 3 Encounters:  10/08/16 141 lb (64 kg)  04/02/16 136 lb (61.7 kg)  11/07/15 136 lb (61.7 kg)   Physical Exam  Constitutional: She is oriented to person, place, and time. She appears well-developed and well-nourished.  HENT:  Head: Normocephalic and atraumatic.  Eyes: Pupils are equal, round, and reactive to light. Conjunctivae are normal.  Neck: Normal range of motion. Neck supple.  Cardiovascular: Normal rate, regular rhythm, normal heart sounds and intact distal pulses.   Multiple varicosities of bilateral lower extremities  +3 edema non pitting   Pulmonary/Chest: Effort normal and breath sounds normal.  Musculoskeletal:  Normal range of motion. She exhibits edema.  Neurological: She is alert and oriented to person, place, and time.  Skin: Skin is warm and dry.  Psychiatric: She has a normal mood and affect. Her behavior is normal. Judgment and thought content normal.    Assessment & Plan:  1. Spider veins - Ambulatory referral to Vascular Surgery  2. Lower extremity venous stasis - Ambulatory referral to Vascular Surgery  3. Leg swelling - Ambulatory referral to Vascular Surgery  Return for care in 6 months for a wellness exam or sooner if needed.  Godfrey PickKimberly S. Tiburcio PeaHarris, MSN, FNP-C [facility]  [address] [place], [place] [address] [phone]

## 2016-10-08 NOTE — Patient Instructions (Signed)
Varicose Veins Varicose veins are veins that have become enlarged and twisted. They are usually seen in the legs but can occur in other parts of the body as well. What are the causes? This condition is the result of valves in the veins not working properly. Valves in the veins help to return blood from the leg to the heart. If these valves are damaged, blood flows backward and backs up into the veins in the leg near the skin. This causes the veins to become larger. What increases the risk? People who are on their feet a lot, who are pregnant, or who are overweight are more likely to develop varicose veins. What are the signs or symptoms?  Bulging, twisted-appearing, bluish veins, most commonly found on the legs.  Leg pain or a feeling of heaviness. These symptoms may be worse at the end of the day.  Leg swelling.  Changes in skin color. How is this diagnosed? A health care provider can usually diagnose varicose veins by examining your legs. Your health care provider may also recommend an ultrasound of your leg veins. How is this treated? Most varicose veins can be treated at home.However, other treatments are available for people who have persistent symptoms or want to improve the cosmetic appearance of the varicose veins. These treatment options include:  Sclerotherapy. A solution is injected into the vein to close it off.  Laser treatment. A laser is used to heat the vein to close it off.  Radiofrequency vein ablation. An electrical current produced by radio waves is used to close off the vein.  Phlebectomy. The vein is surgically removed through small incisions made over the varicose vein.  Vein ligation and stripping. The vein is surgically removed through incisions made over the varicose vein after the vein has been tied (ligated). Follow these instructions at home:   Do not stand or sit in one position for long periods of time. Do not sit with your legs crossed. Rest with your  legs raised during the day.  Wear compression stockings as directed by your health care provider. These stockings help to prevent blood clots and reduce swelling in your legs.  Do not wear other tight, encircling garments around your legs, pelvis, or waist.  Walk as much as possible to increase blood flow.  Raise the foot of your bed at night with 2-inch blocks.  If you get a cut in the skin over the vein and the vein bleeds, lie down with your leg raised and press on it with a clean cloth until the bleeding stops. Then place a bandage (dressing) on the cut. See your health care provider if it continues to bleed. Contact a health care provider if:  The skin around your ankle starts to break down.  You have pain, redness, tenderness, or hard swelling in your leg over a vein.  You are uncomfortable because of leg pain. This information is not intended to replace advice given to you by your health care provider. Make sure you discuss any questions you have with your health care provider. Document Released: 12/20/2004 Document Revised: 08/18/2015 Document Reviewed: 09/13/2015 Elsevier Interactive Patient Education  2017 Elsevier Inc[employer]  

## 2016-10-10 ENCOUNTER — Other Ambulatory Visit: Payer: Self-pay

## 2016-10-10 DIAGNOSIS — I83893 Varicose veins of bilateral lower extremities with other complications: Secondary | ICD-10-CM

## 2016-11-09 ENCOUNTER — Encounter: Payer: Self-pay | Admitting: Vascular Surgery

## 2016-11-21 ENCOUNTER — Ambulatory Visit (HOSPITAL_COMMUNITY)
Admission: RE | Admit: 2016-11-21 | Discharge: 2016-11-21 | Disposition: A | Discharge status: Home / Self Care | Payer: BLUE CROSS/BLUE SHIELD | Source: Ambulatory Visit | Attending: Vascular Surgery | Admitting: Vascular Surgery

## 2016-11-21 ENCOUNTER — Encounter: Payer: Self-pay | Admitting: Vascular Surgery

## 2016-11-21 ENCOUNTER — Ambulatory Visit (INDEPENDENT_AMBULATORY_CARE_PROVIDER_SITE_OTHER): Payer: BLUE CROSS/BLUE SHIELD | Admitting: Vascular Surgery

## 2016-11-21 VITALS — BP 136/89 | HR 79 | Temp 98.5°F | Ht 65.0 in | Wt 141.5 lb

## 2016-11-21 DIAGNOSIS — I83893 Varicose veins of bilateral lower extremities with other complications: Secondary | ICD-10-CM | POA: Diagnosis not present

## 2016-11-21 DIAGNOSIS — I872 Venous insufficiency (chronic) (peripheral): Secondary | ICD-10-CM

## 2016-11-21 NOTE — Progress Notes (Signed)
Patient name: [name] MRN: [id number] DOB: Feb 14, 1976 Sex: female   REASON FOR CONSULT:    Chronic venous insufficiency. The consult is requested by Joaquin Courts, NP.   HPI:   [name] is a pleasant 41 y.o. female,  Who is referred with chronic venous insufficiency. 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 About a year ago she developed some cellulitis in her right leg and was felt to have chronic venous insufficiency. She is unaware of any previous history of DVT or phlebitis. She describes aching pain and heaviness in her legs which is aggravated by standing and sitting and relieved with elevation. She has worn some compression stockings but these were not medical grade compression stockings. There are no other aggravating or alleviating factors. Her spider veins have been gradually progressive.  I have reviewed the records that were sent from the referring office. The patient was evaluated on 10/08/2016 with bilateral lower extremity swelling and spider veins.  Past Medical History:  Diagnosis Date  . Varicose veins of both lower extremities 09/19/2015  . Venous stasis dermatitis of right lower extremity 10/08/2015    Family History  Problem Relation Age of Onset  . Hypertension Mother   . Edema Sister        leg     SOCIAL HISTORY: Social History   Social History  . Marital status: Single    Spouse name: N/A  . Number of children: N/A  . Years of education: N/A   Occupational History  . Not on file.   Social History Main Topics  . Smoking status: Never Smoker  . Smokeless tobacco: Never Used  . Alcohol use No  . Drug use: No  . Sexual activity: Not Currently   Other Topics Concern  . Not on file   Social History Narrative  . No narrative on file    Allergies  Allergen Reactions  . Keflex [Cephalexin] Hives    Current Outpatient Prescriptions  Medication  Sig Dispense Refill  . cetirizine (ZYRTEC) 10 MG tablet Take 10 mg by mouth daily.    . diphenhydrAMINE (BENADRYL) 25 MG tablet Take 2 tablets (50 mg total) by mouth every 4 (four) hours as needed for itching or allergies. 60 tablet 0  . hydrOXYzine (ATARAX/VISTARIL) 25 MG tablet Take 1 tablet (25 mg total) by mouth every 6 (six) hours. 12 tablet 0  . ibuprofen (ADVIL,MOTRIN) 200 MG tablet Take 600 mg by mouth every 6 (six) hours as needed for moderate pain.    . ranitidine (ZANTAC) 150 MG tablet Take 1 tablet (150 mg total) by mouth 2 (two) times daily. Take until the rash/swelling resolves 30 tablet 0   No current facility-administered medications for this visit.     REVIEW OF SYSTEMS:  [X]  denotes positive finding, [ ]  denotes negative finding Cardiac  Comments:  Chest pain or chest pressure:    Shortness of breath upon exertion:    Short of breath when lying flat:    Irregular heart rhythm:        Vascular    Pain in calf, thigh, or hip brought on by ambulation: X   Pain in feet at night that wakes you up from your sleep:  X   Blood clot in your veins:    Leg swelling:  X       Pulmonary    Oxygen  at home:    Productive cough:     Wheezing:         Neurologic    Sudden weakness in arms or legs:     Sudden numbness in arms or legs:     Sudden onset of difficulty speaking or slurred speech:    Temporary loss of vision in one eye:     Problems with dizziness:         Gastrointestinal    Blood in stool:     Vomited blood:         Genitourinary    Burning when urinating:     Blood in urine:        Psychiatric    Major depression:         Hematologic    Bleeding problems:    Problems with blood clotting too easily:        Skin    Rashes or ulcers:        Constitutional    Fever or chills:     PHYSICAL EXAM:   Vitals:   11/21/16 1401  BP: 136/89  Pulse: 79  Temp: 98.5 F (36.9 C)  Weight: 141 lb 8 oz (64.2 kg)  Height: 5\' 5"  (1.651 m)    GENERAL: The  patient is a well-nourished female, in no acute distress. The vital signs are documented above. CARDIAC: There is a regular rate and rhythm.  VASCULAR: I do not detect carotid bruits. She has palpable femoral pulses and palpable pedal pulses bilaterally. She has mild bilateral lower extremity swelling which is more significant on the right side. She has telangiectasias in both lower extremities. She has hyperpigmentation in the right leg above the ankle. PULMONARY: There is good air exchange bilaterally without wheezing or rales. ABDOMEN: Soft and non-tender with normal pitched bowel sounds.  MUSCULOSKELETAL: There are no major deformities or cyanosis. NEUROLOGIC: No focal weakness or paresthesias are detected. SKIN: There are no ulcers or rashes noted. PSYCHIATRIC: The patient has a normal affect.  DATA:    BILATERAL LOWER EXTREMITY VENOUS DUPLEX: I have independently interpreted her bilateral lower extremity venous duplex scan.  On the right side, there is no evidence of deep venous thrombosis or superficial thrombophlebitis. There is deep venous reflux involving the common femoral vein. There is superficial venous reflux involving the small saphenous vein.  On the left side, there is no evidence of deep venous thrombosis or superficial thrombophlebitis. There is deep venous reflux involving the common femoral vein. There is no significant superficial venous reflux.  MEDICAL ISSUES:   CHRONIC VENOUS INSUFFICIENCY: This patient does have deep venous reflux bilaterally. She does not have significant superficial venous reflux. We have discussed the importance of intermittent leg elevation in the proper positioning for this. In addition I have written her a prescription for knee-high compression stockings with a gradient of 15-20 mmHg. I've encouraged her to avoid prolonged sitting and standing and to exercise as much as possible. We also discussed water aerobics which I think is very helpful  for people with chronic venous insufficiency. She understands that this is a chronic problem and that she'll need to make some lifestyle changes to stay on top of this. Certainly I'll be happy to see her back in any time if her symptoms progress. Currently she is not a candidate for any type of laser ablation of the superficial system she has no significant reflux in the superficial system.  Waverly Ferrariickson, Murlean Seelye [facility]  [phone] 

## 2017-04-08 ENCOUNTER — Encounter: Payer: Self-pay | Admitting: Family Medicine

## 2017-04-08 ENCOUNTER — Ambulatory Visit: Payer: BLUE CROSS/BLUE SHIELD | Admitting: Family Medicine

## 2017-04-08 ENCOUNTER — Ambulatory Visit (INDEPENDENT_AMBULATORY_CARE_PROVIDER_SITE_OTHER): Payer: BLUE CROSS/BLUE SHIELD | Admitting: Family Medicine

## 2017-04-08 VITALS — BP 132/74 | HR 70 | Temp 98.2°F | Resp 14 | Ht 65.0 in | Wt 135.0 lb

## 2017-04-08 DIAGNOSIS — Z0000 Encounter for general adult medical examination without abnormal findings: Secondary | ICD-10-CM

## 2017-04-08 DIAGNOSIS — N898 Other specified noninflammatory disorders of vagina: Secondary | ICD-10-CM

## 2017-04-08 DIAGNOSIS — Z113 Encounter for screening for infections with a predominantly sexual mode of transmission: Secondary | ICD-10-CM | POA: Diagnosis not present

## 2017-04-08 LAB — POCT URINALYSIS DIP (DEVICE)
BILIRUBIN URINE: NEGATIVE
Glucose, UA: NEGATIVE mg/dL
HGB URINE DIPSTICK: NEGATIVE
Ketones, ur: NEGATIVE mg/dL
LEUKOCYTES UA: NEGATIVE
NITRITE: NEGATIVE
Protein, ur: NEGATIVE mg/dL
Specific Gravity, Urine: 1.02 (ref 1.005–1.030)
UROBILINOGEN UA: 0.2 mg/dL (ref 0.0–1.0)
pH: 7 (ref 5.0–8.0)

## 2017-04-08 MED ORDER — METRONIDAZOLE 0.75 % VA GEL: 1.0000 | Freq: Two times a day (BID) | VAGINAL | 0 refills | Status: DC

## 2017-04-08 NOTE — Patient Instructions (Addendum)
Deep Vein Thrombosis Deep vein thrombosis (DVT) is a condition in which a blood clot forms in a deep vein, such as a lower leg, thigh, or arm vein. A clot is blood that has thickened into a gel or solid. This condition is dangerous. It can lead to serious and even life-threatening complications if the clot travels to the lungs and causes a blockage (pulmonary embolism). It can also damage veins in the leg. This can result in leg pain, swelling, discoloration, and sores (post-thrombotic syndrome). What are the causes? This condition may be caused by:  A slowdown of blood flow.  Damage to a vein.  A condition that makes blood clot more easily.  What increases the risk? The following factors may make you more likely to develop this condition:  Being overweight.  Being elderly, especially over age 45.  Sitting or lying down for more than four hours.  Lack of physical activity (sedentary lifestyle).  Being pregnant, giving birth, or having recently given birth.  Taking medicines that contain estrogen.  Smoking.  A history of any of the following: ? Blood clots or blood clotting disease. ? Peripheral vascular disease. ? Inflammatory bowel disease. ? Cancer. ? Heart disease. ? Genetic conditions that affect how blood clots. ? Neurological diseases that affect the legs (leg paresis). ? Injury. ? Major or lengthy surgery. ? A central line placed inside a large vein.  What are the signs or symptoms? Symptoms of this condition include:  Swelling, pain, or tenderness in an arm or leg.  Warmth, redness, or discoloration in an arm or leg.  If the clot is in your leg, symptoms may be more noticeable or worse when you stand or walk. Some people do not have any symptoms. How is this diagnosed? This condition is diagnosed with:  A medical history.  A physical exam.  Tests, such as: ? Blood tests. These are done to see how your blood clots. ? Imaging tests. These are done to  check for clots. Tests may include:  Ultrasound.  CT scan.  MRI.  X-ray.  Venogram. For this test, X-rays are taken after a dye is injected into a vein.  How is this treated? Treatment for this condition depends on the cause, your risk for bleeding or developing more clots, and any medical conditions you have. Treatment may include:  Taking blood thinners (also called anticoagulants). These medicines may be taken by mouth, injected under the skin, or injected through an IV tube (catheter). These medicines prevent clots from forming.  Injecting medicine that dissolves blood clots into the affected vein (catheter-directed thrombolysis).  Having surgery. Surgery may be done to: ? Remove the clot. ? Place a filter in a large vein to catch blood clots before they reach the lungs.  Some treatments may be continued for up to six months. Follow these instructions at home: If you are taking an oral blood thinner:  Take the medicine exactly as told by your health care provider. Some blood thinners need to be taken at the same time every day. Do not skip a dose.  Ask your health care provider about what foods and drugs interact with the medicine.  Ask about possible side effects. General instructions  Blood thinners can cause easy bruising and difficulty stopping bleeding. Because of this, if you are taking or were given a blood thinner: ? Hold pressure over cuts for longer than usual. ? Tell your dentist and other health care providers that you are taking blood thinners  before having any procedures that can cause bleeding. ? Avoid contact sports.  Take over-the-counter and prescription medicines only as told by your health care provider.  Return to your normal activities as told by your health care provider. Ask your health care provider what activities are safe for you.  Wear compression stockings if recommended by your health care provider.  Keep all follow-up visits as told by  your health care provider. This is important. How is this prevented? To lower your risk of developing this condition again:  For 30 or more minutes every day, do an activity that: ? Involves moving your arms and legs. ? Increases your heart rate.  When traveling for longer than four hours: ? Exercise your arms and legs every hour. ? Drink plenty of water. ? Avoid drinking alcohol.  Avoid sitting or lying for a long time without moving your legs.  Stay a healthy weight.  If you are a woman who is older than age 13, avoid unnecessary use of medicines that contain estrogen.  Do not use any products that contain nicotine or tobacco, such as cigarettes and e-cigarettes. This is especially important if you take estrogen medicines. If you need help quitting, ask your health care provider.  Contact a health care provider if:  You miss a dose of your blood thinner.  You have nausea, vomiting, or diarrhea that lasts for more than one day.  Your menstrual period is heavier than usual.  You have unusual bruising. Get help right away if:  You have new or increased pain, swelling, or redness in an arm or leg.  You have numbness or tingling in an arm or leg.  You have shortness of breath.  You have chest pain.  You have a rapid or irregular heartbeat.  You feel light-headed or dizzy.  You cough up blood.  There is blood in your vomit, stool, or urine.  You have a serious fall or accident, or you hit your head.  You have a severe headache or confusion.  You have a cut that will not stop bleeding. These symptoms may represent a serious problem that is an emergency. Do not wait to see if the symptoms will go away. Get medical help right away. Call your local emergency services (911 in the [place] Do not drive yourself to the hospital. Summary  DVT is a condition in which a blood clot forms in a deep vein, such as a lower leg, thigh, or arm vein.  Symptoms can include swelling,  warmth, pain, and redness in your leg or arm.  Treatment may include taking blood thinners, injecting medicine that dissolves blood clots,wearing compression stockings, or surgery.  If you are prescribed blood thinners, take them exactly as told. This information is not intended to replace advice given to you by your health care provider. Make sure you discuss any questions you have with your health care provider. Document Released: 03/12/2005 Document Revised: 04/14/2016 Document Reviewed: 04/14/2016 Elsevier Interactive Patient Education  2018 [employer].    Chronic Venous Insufficiency Chronic venous insufficiency, also called venous stasis, is a condition that prevents blood from being pumped effectively through the veins in your legs. Blood may no longer be pumped effectively from the legs back to the heart. This condition can range from mild to severe. With proper treatment, you should be able to continue with an active life. What are the causes? Chronic venous insufficiency occurs when the vein walls become stretched, weakened, or damaged, or when valves within  the vein are damaged. Some common causes of this include:  High blood pressure inside the veins (venous hypertension).  Increased blood pressure in the leg veins from long periods of sitting or standing.  A blood clot that blocks blood flow in a vein (deep vein thrombosis, DVT).  Inflammation of a vein (phlebitis) that causes a blood clot to form.  Tumors in the pelvis that cause blood to back up.  What increases the risk? The following factors may make you more likely to develop this condition:  Having a family history of this condition.  Obesity.  Pregnancy.  Living without enough physical activity or exercise (sedentary lifestyle).  Smoking.  Having a job that requires long periods of standing or sitting in one place.  Being a certain age. Women in their 70s and 35s and men in their 51s are more likely to  develop this condition.  What are the signs or symptoms? Symptoms of this condition include:  Veins that are enlarged, bulging, or twisted (varicose veins).  Skin breakdown or ulcers.  Reddened or discolored skin on the front of the leg.  Brown, smooth, tight, and painful skin just above the ankle, usually on the inside of the leg (lipodermatosclerosis).  Swelling.  How is this diagnosed? This condition may be diagnosed based on:  Your medical history.  A physical exam.  Tests, such as: ? A procedure that creates an image of a blood vessel and nearby organs and provides information about blood flow through the blood vessel (duplex ultrasound). ? A procedure that tests blood flow (plethysmography). ? A procedure to look at the veins using X-ray and dye (venogram).  How is this treated? The goals of treatment are to help you return to an active life and to minimize pain or disability. Treatment depends on the severity of your condition, and it may include:  Wearing compression stockings. These can help relieve symptoms and help prevent your condition from getting worse. However, they do not cure the condition.  Sclerotherapy. This is a procedure involving an injection of a material that "dissolves" damaged veins.  Surgery. This may involve: ? Removing a diseased vein (vein stripping). ? Cutting off blood flow through the vein (laser ablation surgery). ? Repairing a valve.  Follow these instructions at home:  Wear compression stockings as told by your health care provider. These stockings help to prevent blood clots and reduce swelling in your legs.  Take over-the-counter and prescription medicines only as told by your health care provider.  Stay active by exercising, walking, or doing different activities. Ask your health care provider what activities are safe for you and how much exercise you need.  Drink enough fluid to keep your urine clear or pale yellow.  Do not  use any products that contain nicotine or tobacco, such as cigarettes and e-cigarettes. If you need help quitting, ask your health care provider.  Keep all follow-up visits as told by your health care provider. This is important. Contact a health care provider if:  You have redness, swelling, or more pain in the affected area.  You see a red streak or line that extends up or down from the affected area.  You have skin breakdown or a loss of skin in the affected area, even if the breakdown is small.  You get an injury in the affected area. Get help right away if:  You get an injury and an open wound in the affected area.  You have severe pain that does  not get better with medicine.  You have sudden numbness or weakness in the foot or ankle below the affected area, or you have trouble moving your foot or ankle.  You have a fever and you have worse or persistent symptoms.  You have chest pain.  You have shortness of breath. Summary  Chronic venous insufficiency, also called venous stasis, is a condition that prevents blood from being pumped effectively through the veins in your legs.  Chronic venous insufficiency occurs when the vein walls become stretched, weakened, or damaged, or when valves within the vein are damaged.  Treatment for this condition depends on how severe your condition is, and it may involve wearing compression stockings or having a procedure.  Make sure you stay active by exercising, walking, or doing different activities. Ask your health care provider what activities are safe for you and how much exercise you need. This information is not intended to replace advice given to you by your health care provider. Make sure you discuss any questions you have with your health care provider. Document Released: 07/16/2006 Document Revised: 01/30/2016 Document Reviewed: 01/30/2016 Elsevier Interactive Patient Education  2017 [employer] Inc[employer]  Bacterial Vaginosis Bacterial  vaginosis is an infection of the vagina. It happens when too many germs (bacteria) grow in the vagina. This infection puts you at risk for infections from sex (STIs). Treating this infection can lower your risk for some STIs. You should also treat this if you are pregnant. It can cause your baby to be born early. Follow these instructions at home: Medicines  Take over-the-counter and prescription medicines only as told by your doctor.  Take or use your antibiotic medicine as told by your doctor. Do not stop taking or using it even if you start to feel better. General instructions  If you your sexual partner is a woman, tell her that you have this infection. She needs to get treatment if she has symptoms. If you have a female partner, he does not need to be treated.  During treatment: ? Avoid sex. ? Do not douche. ? Avoid alcohol as told. ? Avoid breastfeeding as told.  Drink enough fluid to keep your pee (urine) clear or pale yellow.  Keep your vagina and butt (rectum) clean. ? Wash the area with warm water every day. ? Wipe from front to back after you use the toilet.  Keep all follow-up visits as told by your doctor. This is important. Preventing this condition  Do not douche.  Use only warm water to wash around your vagina.  Use protection when you have sex. This includes: ? Latex condoms. ? Dental dams.  Limit how many people you have sex with. It is best to only have sex with the same person (be monogamous).  Get tested for STIs. Have your partner get tested.  Wear underwear that is cotton or lined with cotton.  Avoid tight pants and pantyhose. This is most important in summer.  Do not use any products that have nicotine or tobacco in them. These include cigarettes and e-cigarettes. If you need help quitting, ask your doctor.  Do not use illegal drugs.  Limit how much alcohol you drink. Contact a doctor if:  Your symptoms do not get better, even after you are  treated.  You have more discharge or pain when you pee (urinate).  You have a fever.  You have pain in your belly (abdomen).  You have pain with sex.  Your bleed from your vagina between periods. Summary  This infection happens when too many germs (bacteria) grow in the vagina.  Treating this condition can lower your risk for some infections from sex (STIs).  You should also treat this if you are pregnant. It can cause early (premature) birth.  Do not stop taking or using your antibiotic medicine even if you start to feel better. This information is not intended to replace advice given to you by your health care provider. Make sure you discuss any questions you have with your health care provider. Document Released: 12/20/2007 Document Revised: 11/26/2015 Document Reviewed: 11/26/2015 Elsevier Interactive Patient Education  2017 [employer]

## 2017-04-08 NOTE — Progress Notes (Signed)
Patient ID: [name], female    DOB: 10-Mar-1976, 42 y.o.   MRN: [id number]  PCP: Bing Neighbors, FNP  Chief Complaint  Patient presents with  . Wellness exam  . vaginal odor    Subjective:  HPI [name] is a 42 y.o. female with a history of chronic venous insufficiently and chronic BLE edema, presents for routine wellness visit.   [name] was last seen in office for an acute visit 6 months ago in which she complained of worsening bilateral leg edema and profound spider veins.  During that visit she was referred to vascular specialty and was found to have had chronic venous insufficiency.  She reports that due to significant of her venous patterns she is not a candidate for surgery. Therefore conservative measures were recommended to control her chronic bilateral leg edema.  Such interventions include aerobic physical activity and compression stockings. She admits that she has not started consistently implementing these activities.  She denies any worsening of her leg swelling and denies leg pain.  [name] complains of vaginal odor today. She has a history of BV, although is concerned for STD infection as she has a long-distance relationship and they recently engaged in unprotected sexual intercourse. Reports vaginal odor only -no discharge, dysuria, pelvic pain, nausea, or vomiting.   Health maintenance: Pap is up-to-date.  Breast mammogram is up-to-date.  HIV  screen overdue.  Patient declines influenza vaccine. Social History   Socioeconomic History  . Marital status: Single    Spouse name: Not on file  . Number of children: Not on file  . Years of education: Not on file  . Highest education level: Not on file  Social Needs  . Financial resource strain: Not on file  . Food insecurity - worry: Not on file  . Food insecurity - inability: Not on file  . Transportation needs - medical: Not on file  . Transportation needs - non-medical: Not on file  Occupational History  . Not  on file  Tobacco Use  . Smoking status: Never Smoker  . Smokeless tobacco: Never Used  Substance and Sexual Activity  . Alcohol use: No  . Drug use: No  . Sexual activity: Not Currently  Other Topics Concern  . Not on file  Social History Narrative  . Not on file    Family History  Problem Relation Age of Onset  . Hypertension Mother   . Edema Sister        leg    Review of Systems  Respiratory: Negative.   Cardiovascular: Positive for leg swelling.  Genitourinary:       Vaginal odor  Skin: Negative.   Neurological: Negative.   Psychiatric/Behavioral: Negative.     Patient Active Problem List   Diagnosis Date Noted  . Venous stasis dermatitis of right lower extremity 10/08/2015  . Irritant contact dermatitis due to chemical 10/08/2015  . Varicose veins of leg with edema 09/19/2015    Allergies  Allergen Reactions  . Keflex [Cephalexin] Hives    Prior to Admission medications   Medication Sig Start Date End Date Taking? Authorizing Provider  cetirizine (ZYRTEC) 10 MG tablet Take 10 mg by mouth daily.   Yes [provider]  diphenhydrAMINE (BENADRYL) 25 MG tablet Take 2 tablets (50 mg total) by mouth every 4 (four) hours as needed for itching or allergies. 09/18/15  Yes Street, [place], PA-C  hydrOXYzine (ATARAX/VISTARIL) 25 MG tablet Take 1 tablet (25 mg total) by mouth every 6 (  six) hours. 09/28/15  Yes Dowless, Samantha Tripp, PA-C  ibuprofen (ADVIL,MOTRIN) 200 MG tablet Take 600 mg by mouth every 6 (six) hours as needed for moderate pain.   Yes [provider]  ranitidine (ZANTAC) 150 MG tablet Take 1 tablet (150 mg total) by mouth 2 (two) times daily. Take until the rash/swelling resolves Patient not taking: Reported on 04/08/2017 09/18/15   Street, [place], PA-C    Past Medical, Surgical Family and Social History reviewed and updated.    Objective:   Today's Vitals   04/08/17 1059  BP: 132/74  Pulse: 70  Resp: 14  Temp: 98.2 F (36.8  C)  TempSrc: Oral  SpO2: 100%  Weight: 135 lb (61.2 kg)  Height: 5\' 5"  (1.651 m)    Wt Readings from Last 3 Encounters:  04/08/17 135 lb (61.2 kg)  11/21/16 141 lb 8 oz (64.2 kg)  10/08/16 141 lb (64 kg)   Physical Exam  Constitutional: She is oriented to person, place, and time and well-developed, well-nourished, and in no distress.  HENT:  Head: Normocephalic and atraumatic.  Right Ear: External ear normal.  Left Ear: External ear normal.  Nose: Nose normal.  Mouth/Throat: Oropharynx is clear and moist.  Eyes: Conjunctivae and EOM are normal. Pupils are equal, round, and reactive to light.  Neck: Normal range of motion. Neck supple. No thyromegaly present.  Cardiovascular: Normal rate, normal heart sounds and intact distal pulses.  Pulmonary/Chest: Effort normal and breath sounds normal.  Abdominal: Soft. Bowel sounds are normal.  Genitourinary:  Genitourinary Comments: Vaginal self swab collected  Musculoskeletal: Normal range of motion. She exhibits edema.  Bilateral lower extremity trace edema nonpitting.  Lymphadenopathy:    She has no cervical adenopathy.  Neurological: She is alert and oriented to person, place, and time. Gait normal. GCS score is 15.  Skin: Skin is warm and dry.  Psychiatric: Mood, memory, affect and judgment normal.   Assessment & Plan:  1. Well adult exam.  Age-appropriate anticipatory guidance provided. Patient is not fasting today and will schedule an appointment to return this week for fasting labs.   Will order a CMP, CBC, vitamin D, TSH, lipid panel.  2. Vaginal odor, will screen for gonorrhea, chlamydia, yeast, BV.  Patient has a history of BV will treat empirically with metronidazole gel until results of vaginal swab was returned. NuSwab Vaginitis Plus (VG+)  3. Screen for STD (sexually transmitted disease), will check a RPR and HIV when patient returns for fasting labs. Meds ordered this encounter  Medications  . metroNIDAZOLE  (METROGEL VAGINAL) 0.75 % vaginal gel    Sig: Place 1 Applicatorful vaginally 2 (two) times daily.    Dispense:  70 g    Refill:  0    Order Specific Question:   Supervising Provider    Answer:   Quentin AngstJEGEDE, OLUGBEMIGA E [[id number]]    Orders Placed This Encounter  Procedures  . Comprehensive metabolic panel  . CBC with Differential  . Thyroid Panel With TSH  . VITAMIN D 25 Hydroxy (Vit-D Deficiency, Fractures)  . NuSwab Vaginitis Plus (VG+)  . RPR  . HIV antibody (with reflex)  . POCT urinalysis dip (device)    Return for care in 12 months for routine annual physical.  Godfrey PickKimberly S. Tiburcio PeaHarris, MSN, FNP-C [facility]  [address] [place], [place] [address] [phone]

## 2017-04-09 ENCOUNTER — Other Ambulatory Visit (INDEPENDENT_AMBULATORY_CARE_PROVIDER_SITE_OTHER): Payer: BLUE CROSS/BLUE SHIELD

## 2017-04-09 DIAGNOSIS — Z0000 Encounter for general adult medical examination without abnormal findings: Secondary | ICD-10-CM

## 2017-04-10 LAB — THYROID PANEL WITH TSH
Free Thyroxine Index: 1.5 (ref 1.2–4.9)
T3 UPTAKE RATIO: 21 % — AB (ref 24–39)
T4, Total: 7.3 ug/dL (ref 4.5–12.0)
TSH: 1.81 u[IU]/mL (ref 0.450–4.500)

## 2017-04-10 LAB — COMPREHENSIVE METABOLIC PANEL
A/G RATIO: 1.2 (ref 1.2–2.2)
ALBUMIN: 4.2 g/dL (ref 3.5–5.5)
ALT: 12 IU/L (ref 0–32)
AST: 18 IU/L (ref 0–40)
Alkaline Phosphatase: 68 IU/L (ref 39–117)
BUN/Creatinine Ratio: 18 (ref 9–23)
BUN: 15 mg/dL (ref 6–24)
Bilirubin Total: 0.3 mg/dL (ref 0.0–1.2)
CALCIUM: 9 mg/dL (ref 8.7–10.2)
CO2: 21 mmol/L (ref 20–29)
Chloride: 104 mmol/L (ref 96–106)
Creatinine, Ser: 0.85 mg/dL (ref 0.57–1.00)
GFR calc non Af Amer: 84 mL/min/{1.73_m2} (ref >59)
GFR, EST AFRICAN AMERICAN: 97 mL/min/{1.73_m2} (ref >59)
Globulin, Total: 3.4 g/dL (ref 1.5–4.5)
Glucose: 89 mg/dL (ref 65–99)
Potassium: 4.2 mmol/L (ref 3.5–5.2)
Sodium: 138 mmol/L (ref 134–144)
TOTAL PROTEIN: 7.6 g/dL (ref 6.0–8.5)

## 2017-04-10 LAB — CBC WITH DIFFERENTIAL/PLATELET
BASOS: 1 %
Basophils Absolute: 0 10*3/uL (ref 0.0–0.2)
EOS (ABSOLUTE): 0.1 10*3/uL (ref 0.0–0.4)
Eos: 1 %
HEMOGLOBIN: 10.8 g/dL — AB (ref 11.1–15.9)
Hematocrit: 35.1 % (ref 34.0–46.6)
IMMATURE GRANS (ABS): 0 10*3/uL (ref 0.0–0.1)
IMMATURE GRANULOCYTES: 0 %
LYMPHS: 37 %
Lymphocytes Absolute: 1.8 10*3/uL (ref 0.7–3.1)
MCH: 27.6 pg (ref 26.6–33.0)
MCHC: 30.8 g/dL — ABNORMAL LOW (ref 31.5–35.7)
MCV: 90 fL (ref 79–97)
MONOCYTES: 14 %
Monocytes Absolute: 0.7 10*3/uL (ref 0.1–0.9)
NEUTROS PCT: 47 %
Neutrophils Absolute: 2.3 10*3/uL (ref 1.4–7.0)
PLATELETS: 228 10*3/uL (ref 150–379)
RBC: 3.92 x10E6/uL (ref 3.77–5.28)
RDW: 14.9 % (ref 12.3–15.4)
WBC: 4.9 10*3/uL (ref 3.4–10.8)

## 2017-04-10 LAB — VITAMIN D 25 HYDROXY (VIT D DEFICIENCY, FRACTURES): VIT D 25 HYDROXY: 8.9 ng/mL — AB (ref 30.0–100.0)

## 2017-04-11 ENCOUNTER — Telehealth: Payer: Self-pay | Admitting: Family Medicine

## 2017-04-11 LAB — NUSWAB VAGINITIS PLUS (VG+)
ATOPOBIUM VAGINAE: HIGH {score} — AB
CANDIDA ALBICANS, NAA: NEGATIVE
CANDIDA GLABRATA, NAA: NEGATIVE
CHLAMYDIA TRACHOMATIS, NAA: NEGATIVE
NEISSERIA GONORRHOEAE, NAA: NEGATIVE
TRICH VAG BY NAA: NEGATIVE

## 2017-04-11 MED ORDER — VITAMIN D (ERGOCALCIFEROL) 1.25 MG (50000 UNIT) PO CAPS: 50000.0000 [IU] | ORAL_CAPSULE | ORAL | 0 refills | Status: DC

## 2017-04-11 NOTE — Telephone Encounter (Signed)
Patient notified and will start medication  

## 2017-04-11 NOTE — Telephone Encounter (Signed)
Contact patient to advise that her vaginal swab was only significant for bacterial vaginosis which she is currently prescribed Metro gel for she should continue as prescribed.  All of her labs were normal with the exception of her vitamin D level indicates a vitamin D patient see.  I am prescribing ergocalciferol to his vitamin D replacement 50,000 units once weekly times 12 weeks.  This medication is been sent to the pharmacy.  Godfrey PickKimberly S. Tiburcio PeaHarris, MSN, FNP-C [facility]  [address] [place], [place] [address] [phone]

## 2018-03-23 ENCOUNTER — Encounter (HOSPITAL_BASED_OUTPATIENT_CLINIC_OR_DEPARTMENT_OTHER): Payer: Self-pay | Admitting: Emergency Medicine

## 2018-03-23 ENCOUNTER — Other Ambulatory Visit: Payer: Self-pay

## 2018-03-23 ENCOUNTER — Emergency Department (HOSPITAL_BASED_OUTPATIENT_CLINIC_OR_DEPARTMENT_OTHER)
Admission: EM | Admit: 2018-03-23 | Discharge: 2018-03-23 | Disposition: A | Discharge status: Home / Self Care | Payer: BLUE CROSS/BLUE SHIELD | Attending: Emergency Medicine | Admitting: Emergency Medicine

## 2018-03-23 DIAGNOSIS — I10 Essential (primary) hypertension: Secondary | ICD-10-CM | POA: Diagnosis not present

## 2018-03-23 DIAGNOSIS — B029 Zoster without complications: Secondary | ICD-10-CM | POA: Diagnosis not present

## 2018-03-23 DIAGNOSIS — Z79899 Other long term (current) drug therapy: Secondary | ICD-10-CM | POA: Diagnosis not present

## 2018-03-23 DIAGNOSIS — R21 Rash and other nonspecific skin eruption: Secondary | ICD-10-CM | POA: Diagnosis not present

## 2018-03-23 MED ORDER — VALACYCLOVIR HCL 1 G PO TABS: 1000.0000 mg | ORAL_TABLET | Freq: Three times a day (TID) | ORAL | 0 refills | Status: DC

## 2018-03-23 NOTE — ED Notes (Signed)
ED Provider at bedside. 

## 2018-03-23 NOTE — Discharge Instructions (Addendum)

## 2018-03-23 NOTE — ED Triage Notes (Signed)
Patient states that she has a rash on her left leg and she has sensitivity/pain in area of rash. She thinks she has shingles

## 2018-03-23 NOTE — ED Notes (Signed)
Pt presents with rash to left leg in thigh and knee area. She states some pain and itching. Various stages of blistering.

## 2018-03-23 NOTE — ED Provider Notes (Signed)
[facility] EMERGENCY DEPARTMENT Provider Note   CSN: [id number] Arrival date & time: 03/23/18  1055     History   Chief Complaint Chief Complaint  Patient presents with  . Rash    HPI [name] is a 42 y.o. female who presents today for evaluation of a rash on her left leg about 3 days ago which had had pain and tingling for about two days before hand.   She reports that she did have the chickenpox when she was younger.  She denies any rashes anywhere other than her left leg.  No nausea vomiting diarrhea or fevers.  No recent trauma.  She denies any possibility of pregnancy.  HPI  Past Medical History:  Diagnosis Date  . Varicose veins of both lower extremities 09/19/2015  . Venous stasis dermatitis of right lower extremity 10/08/2015    Patient Active Problem List   Diagnosis Date Noted  . Venous stasis dermatitis of right lower extremity 10/08/2015  . Irritant contact dermatitis due to chemical 10/08/2015  . Varicose veins of leg with edema 09/19/2015    History reviewed. No pertinent surgical history.   OB History   No obstetric history on file.      Home Medications    Prior to Admission medications   Medication Sig Start Date End Date Taking? Authorizing Provider  cetirizine (ZYRTEC) 10 MG tablet Take 10 mg by mouth daily.    [provider]  diphenhydrAMINE (BENADRYL) 25 MG tablet Take 2 tablets (50 mg total) by mouth every 4 (four) hours as needed for itching or allergies. 09/18/15   Street, PayetteMercedes, PA-C  hydrOXYzine (ATARAX/VISTARIL) 25 MG tablet Take 1 tablet (25 mg total) by mouth every 6 (six) hours. 09/28/15   Dowless, Lelon MastSamantha Tripp, PA-C  ibuprofen (ADVIL,MOTRIN) 200 MG tablet Take 600 mg by mouth every 6 (six) hours as needed for moderate pain.    [provider]  metroNIDAZOLE (METROGEL VAGINAL) 0.75 % vaginal gel Place 1 Applicatorful vaginally 2 (two) times daily. 04/08/17   Bing NeighborsHarris, Kimberly S, FNP  ranitidine  (ZANTAC) 150 MG tablet Take 1 tablet (150 mg total) by mouth 2 (two) times daily. Take until the rash/swelling resolves Patient not taking: Reported on 04/08/2017 09/18/15   Street, [place], PA-C  valACYclovir (VALTREX) 1000 MG tablet Take 1 tablet (1,000 mg total) by mouth 3 (three) times daily. 03/23/18   Cristina GongHammond, Emelynn Rance W, PA-C  Vitamin D, Ergocalciferol, (DRISDOL) 50000 units CAPS capsule Take 1 capsule (50,000 Units total) by mouth every 7 (seven) days. 04/11/17   Bing NeighborsHarris, Kimberly S, FNP    Family History Family History  Problem Relation Age of Onset  . Hypertension Mother   . Edema Sister        leg     Social History Social History   Tobacco Use  . Smoking status: Never Smoker  . Smokeless tobacco: Never Used  Substance Use Topics  . Alcohol use: No  . Drug use: No     Allergies   Keflex [cephalexin]   Review of Systems Review of Systems  Constitutional: Negative for chills and fever.  Skin: Positive for rash.  Neurological: Negative for weakness and headaches.  All other systems reviewed and are negative.    Physical Exam Updated Vital Signs BP (!) 164/99 (BP Location: Right Arm)   Pulse 93   Temp 98.3 F (36.8 C) (Oral)   Resp 20   Ht 5\' 4"  (1.626 m)   Wt 64 kg  LMP 03/12/2018 (Approximate)   SpO2 100%   BMI 24.20 kg/m   Physical Exam Vitals signs and nursing note reviewed.  Constitutional:      General: She is not in acute distress.    Appearance: She is normal weight. She is not ill-appearing.  HENT:     Head: Normocephalic.  Cardiovascular:     Rate and Rhythm: Normal rate.     Comments: 2+ right DP/PT pulses Skin:    General: Skin is warm and dry.     Findings: Rash present. Rash is vesicular.     Comments: Please see clinical images. The rash is in general L3 left leg distribution.  The vesicles are scabbed over on upper thigh and fresh vesicles on lower thigh.   Neurological:     General: No focal deficit present.     Mental  Status: She is alert.     Comments: Sensation intact to right leg.   Psychiatric:        Mood and Affect: Mood normal.        Behavior: Behavior normal.            ED Treatments / Results  Labs (all labs ordered are listed, but only abnormal results are displayed) Labs Reviewed - No data to display  EKG None  Radiology No results found.  Procedures Procedures (including critical care time)  Medications Ordered in ED Medications - No data to display   Initial Impression / Assessment and Plan / ED Course  I have reviewed the triage vital signs and the nursing notes.  Pertinent labs & imaging results that were available during my care of the patient were reviewed by me and considered in my medical decision making (see chart for details).    Rash is tender with grouped clear vesicles on an erythematous base located approximately along dermatome L3 unilaterally on the left.  Pain treated in the department.  Pt without signs of CNS involvement and there is no involvement of the face or eyes; no concern for opthalmic zoster.  Will discharge home with pain management and valacyclovir.  Offered narcotic medication which patient refused. Also recommend calamine lotion and cool compresses as needed for pain control  Return precautions were discussed with patient who states their understanding.  At the time of discharge patient denied any unaddressed complaints or concerns.  Patient is agreeable for discharge home.   Final Clinical Impressions(s) / ED Diagnoses   Final diagnoses:  Herpes zoster without complication  Hypertension, unspecified type    ED Discharge Orders         Ordered    valACYclovir (VALTREX) 1000 MG tablet  3 times daily     03/23/18 1231           Cristina GongHammond, Aerik Polan W, [place]PA-C 03/23/18 1238    Maia PlanLong, Joshua G, MD 03/23/18 1836

## 2018-10-13 ENCOUNTER — Ambulatory Visit (INDEPENDENT_AMBULATORY_CARE_PROVIDER_SITE_OTHER)
Admission: RE | Admit: 2018-10-13 | Discharge: 2018-10-13 | Disposition: A | Discharge status: Home / Self Care | Payer: BLUE CROSS/BLUE SHIELD | Source: Ambulatory Visit

## 2018-10-13 DIAGNOSIS — L290 Pruritus ani: Secondary | ICD-10-CM

## 2018-10-13 DIAGNOSIS — K6289 Other specified diseases of anus and rectum: Secondary | ICD-10-CM

## 2018-10-13 MED ORDER — DOXYCYCLINE HYCLATE 100 MG PO CAPS: 100.0000 mg | ORAL_CAPSULE | Freq: Two times a day (BID) | ORAL | 0 refills | Status: DC

## 2018-10-13 NOTE — ED Provider Notes (Signed)
[facility]     Virtual Visit via Video Note:  [name]  initiated request for [facility] visit with [facility]. I connected with [name]  on 10/13/2018 at 10:42 AM  for a synchronized telemedicine visit using a telephone enabled HIPPA compliant telemedicine application. I verified that I am speaking with [name]  using two identifiers. Rennis HardingBrittany Bradey Luzier, PA-C  was physically located in a [facility] site and [name] was located at a different location.   The limitations of evaluation and management by telemedicine as well as the availability of in-person appointments were discussed. Patient was informed that she  may incur a bill ( including co-pay) for this virtual visit encounter. [name]  expressed understanding and gave verbal consent to proceed with virtual visit.   [id number] 10/13/18 Arrival Time: 1019  CC: Rash  SUBJECTIVE:  [name] is a 43 y.o. female who presents with rectal itching and discomfort x 3 days.  Denies precipitating event or trauma.  Localizes the rash to around anus.  Describes it as painful, itchy with some clear drainage  Has tried OTC hemorrhoid medication, and "natural remedies" without relief.  Symptoms are made worse with walking.  Reports similar symptoms in the past with cellulitis on her ankle.   Denies hx of hemorrhoids.  Denies fever, chills, nausea, vomiting, erythema, swelling, discharge, oral lesions, SOB, chest pain, abdominal pain, changes in bowel or bladder function, blood in stool, blood with wiping, abscess formation.    ROS: As per HPI.  Past Medical History:  Diagnosis Date  . Varicose veins of both lower extremities 09/19/2015  . Venous stasis dermatitis of right lower extremity 10/08/2015   No past surgical history on file. Allergies  Allergen Reactions  . Keflex [Cephalexin] Hives   No current facility-administered medications on file prior to encounter.    Current  Outpatient Medications on File Prior to Encounter  Medication Sig Dispense Refill  . cetirizine (ZYRTEC) 10 MG tablet Take 10 mg by mouth daily.    . diphenhydrAMINE (BENADRYL) 25 MG tablet Take 2 tablets (50 mg total) by mouth every 4 (four) hours as needed for itching or allergies. 60 tablet 0  . hydrOXYzine (ATARAX/VISTARIL) 25 MG tablet Take 1 tablet (25 mg total) by mouth every 6 (six) hours. 12 tablet 0  . ibuprofen (ADVIL,MOTRIN) 200 MG tablet Take 600 mg by mouth every 6 (six) hours as needed for moderate pain.    . metroNIDAZOLE (METROGEL VAGINAL) 0.75 % vaginal gel Place 1 Applicatorful vaginally 2 (two) times daily. 70 g 0  . valACYclovir (VALTREX) 1000 MG tablet Take 1 tablet (1,000 mg total) by mouth 3 (three) times daily. 21 tablet 0  . Vitamin D, Ergocalciferol, (DRISDOL) 50000 units CAPS capsule Take 1 capsule (50,000 Units total) by mouth every 7 (seven) days. 30 capsule 0  . [DISCONTINUED] ranitidine (ZANTAC) 150 MG tablet Take 1 tablet (150 mg total) by mouth 2 (two) times daily. Take until the rash/swelling resolves (Patient not taking: Reported on 04/08/2017) 30 tablet 0   Patient unable to connect to video  OBJECTIVE: There were no vitals filed for this visit.  General appearance: alert; no distress Lungs: normal respiratory effort; speaking in full sentences without difficulty Psychological: alert and cooperative; normal mood and affect  ASSESSMENT & PLAN:  1. Rectal discomfort   2. Rectal itching     Meds ordered this encounter  Medications  . doxycycline (  VIBRAMYCIN) 100 MG capsule    Sig: Take 1 capsule (100 mg total) by mouth 2 (two) times daily.    Dispense:  20 capsule    Refill:  0    Order Specific Question:   Supervising Provider    Answer:   Raylene Everts [[id number]]   Symptoms most likely related to hemorrhoid, but will cover for possible infection based on symptoms Wash site with warm water and mild soap Prescribed doxycycline take as directed  and to completion Continue to alternate ibuprofen and tylenol as needed for pain and fever Follow up in person or with PCP if symptoms persists Follow up in person or go to the ED if you have any new or worsening symptoms such as increased pain, redness, swelling, discharge, high fever, night sweats, abdominal pain, symptoms do not improve with medications over the next 24 hours, etc...   I discussed the assessment and treatment plan with the patient. The patient was provided an opportunity to ask questions and all were answered. The patient agreed with the plan and demonstrated an understanding of the instructions.   The patient was advised to call back or seek an in-person evaluation if the symptoms worsen or if the condition fails to improve as anticipated.  I provided 15 minutes of non-face-to-face time during this encounter.  [place], PA-C  10/13/2018 10:42 AM    Lestine Box, PA-C 10/13/18 1046

## 2018-10-13 NOTE — Discharge Instructions (Signed)
Symptoms most likely related to hemorrhoid, but will cover for possible infection based on symptoms Wash site with warm water and mild soap Prescribed doxycycline take as directed and to completion Continue to alternate ibuprofen and tylenol as needed for pain and fever Follow up in person or with PCP if symptoms persists Follow up in person or go to the ED if you have any new or worsening symptoms such as increased pain, redness, swelling, discharge, high fever, night sweats, abdominal pain, symptoms do not improve with medications over the next 24 hours, etc..Marland Kitchen

## 2018-10-15 ENCOUNTER — Ambulatory Visit (INDEPENDENT_AMBULATORY_CARE_PROVIDER_SITE_OTHER)
Admission: RE | Admit: 2018-10-15 | Discharge: 2018-10-15 | Disposition: A | Discharge status: Home / Self Care | Payer: BC Managed Care – PPO | Source: Ambulatory Visit

## 2018-10-15 DIAGNOSIS — T7840XA Allergy, unspecified, initial encounter: Secondary | ICD-10-CM

## 2018-10-15 MED ORDER — PREDNISONE 10 MG (21) PO TBPK: ORAL_TABLET | ORAL | 0 refills | Status: DC

## 2018-10-15 NOTE — ED Provider Notes (Addendum)
Virtual Visit via Video Note:  [name]  initiated request for Telemedicine visit with [facility] team. I connected with [name]  on 10/15/2018 at 8:42 AM  for a synchronized telemedicine visit using a video enabled HIPPA compliant telemedicine application. I verified that I am speaking with [name]  using two identifiers. Orvan July, NP  was physically located in a [facility] site and PRAPTI GRUSSING was located at a different location.   The limitations of evaluation and management by telemedicine as well as the availability of in-person appointments were discussed. Patient was informed that she  may incur a bill ( including co-pay) for this virtual visit encounter. [name]  expressed understanding and gave verbal consent to proceed with virtual visit.     History of Present Illness:[name]  is a 43 y.o. female presents with allergic reaction.  She is having severe facial swelling with both eyes involved.  This started yesterday and got worse overnight.  She has had some itching.  She contributes this to similar episode she had approximate 3 years ago when she had an infection and then had an allergic reaction.  She thought at that time to be allergic  to Keflex but she is not sure of that.  She has been taking Benadryl for her symptoms.  She denies any oral swelling trouble swallowing or breathing.  She is currently taking doxycycline for an infection.  Reporting that the symptoms started before that.  She is pretty sure that this is not related to the doxycycline.  No fevers, chills, body aches or joint pain.  No known insect bites or changes in lotions, soaps or detergents.  No new foods or contact irritants.  Past Medical History:  Diagnosis Date  . Varicose veins of both lower extremities 09/19/2015  . Venous stasis dermatitis of right lower extremity 10/08/2015    Allergies  Allergen Reactions  . Keflex [Cephalexin] Hives         Observations/Objective:VITALS: Per patient if applicable, see vitals. GENERAL: Alert, appears well and in no acute distress. HEENT: Moderate swelling to entire face with eye involvement. NECK: Normal movements of the head and neck. CARDIOPULMONARY: No increased WOB. Speaking in clear sentences. I:E ratio WNL.  MS: Moves all visible extremities without noticeable abnormality. PSYCH: Pleasant and cooperative, well-groomed. Speech normal rate and rhythm. Affect is appropriate. Insight and judgement are appropriate. Attention is focused, linear, and appropriate.  NEURO: CN grossly intact. Oriented as arrived to appointment on time with no prompting. Moves both UE equally.  SKIN: Welts noted to bilateral arms   Assessment and Plan: Allergic reaction-treating with prednisone taper and recommended Benadryl for itching   Follow Up Instructions: For severe worsening symptoms follow-up in person    I discussed the assessment and treatment plan with the patient. The patient was provided an opportunity to ask questions and all were answered. The patient agreed with the plan and demonstrated an understanding of the instructions.   The patient was advised to call back or seek an in-person evaluation if the symptoms worsen or if the condition fails to improve as anticipated.    Orvan July, NP  10/15/2018 8:42 AM         Orvan July, NP 10/16/18 0102    Orvan July, NP 10/16/18 7253

## 2018-10-15 NOTE — Discharge Instructions (Signed)
Take the medication as prescribed °Follow up as needed for continued or worsening symptoms ° °

## 2018-10-27 ENCOUNTER — Other Ambulatory Visit: Payer: Self-pay

## 2018-10-27 ENCOUNTER — Ambulatory Visit
Admission: EM | Admit: 2018-10-27 | Discharge: 2018-10-27 | Disposition: A | Discharge status: Home / Self Care | Payer: BC Managed Care – PPO | Attending: Physician Assistant | Admitting: Physician Assistant

## 2018-10-27 DIAGNOSIS — B9689 Other specified bacterial agents as the cause of diseases classified elsewhere: Secondary | ICD-10-CM

## 2018-10-27 DIAGNOSIS — L509 Urticaria, unspecified: Secondary | ICD-10-CM

## 2018-10-27 DIAGNOSIS — L03317 Cellulitis of buttock: Secondary | ICD-10-CM

## 2018-10-27 MED ORDER — DEXAMETHASONE SODIUM PHOSPHATE 10 MG/ML IJ SOLN
10.0000 mg | Freq: Once | INTRAMUSCULAR | Status: AC
  Administered 2018-10-27: 10 mg via INTRAMUSCULAR

## 2018-10-27 MED ORDER — AMOXICILLIN-POT CLAVULANATE 875-125 MG PO TABS: 1.0000 | ORAL_TABLET | Freq: Two times a day (BID) | ORAL | 0 refills | Status: DC

## 2018-10-27 MED ORDER — METHYLPREDNISOLONE 4 MG PO TBPK: ORAL_TABLET | ORAL | 0 refills | Status: DC

## 2018-10-27 NOTE — ED Provider Notes (Signed)
[facility]    CSN: [id number] Arrival date & time: 10/27/18  1005     History   Chief Complaint Chief Complaint  Patient presents with  . Allergic Reaction    HPI [name] is a 43 y.o. female.   43 year old female comes in for allergic reaction.  Patient states has a history of chronic venous insufficiency causing leg swelling.  States during leg swelling a few years ago, had one spot on the leg that was itching.  After scratching that area, had hives throughout the whole body and swelling to the face.  She was subsequently diagnosed with cellulitis to the leg, and was put on Keflex.  States Keflex was documented as an allergy at the time, but hives started prior to taking Keflex.  2 weeks ago, started having itching sensation to the rectum and cause slight pain.  She did a video visit and was started on doxycycline.  She started having hives and facial swelling similar to a few years ago, and did a second video visit and was started on prednisone.  States symptoms greatly improved after doxycycline and prednisone, but worsened significantly after finishing the course.  She denies new exposure, oral swelling, trouble breathing, trouble swallowing, tripoding, drooling.     Past Medical History:  Diagnosis Date  . Varicose veins of both lower extremities 09/19/2015  . Venous stasis dermatitis of right lower extremity 10/08/2015    Patient Active Problem List   Diagnosis Date Noted  . Venous stasis dermatitis of right lower extremity 10/08/2015  . Irritant contact dermatitis due to chemical 10/08/2015  . Varicose veins of leg with edema 09/19/2015    History reviewed. No pertinent surgical history.  OB History   No obstetric history on file.      Home Medications    Prior to Admission medications   Medication Sig Start Date End Date Taking? Authorizing Provider  amoxicillin-clavulanate (AUGMENTIN) 875-125 MG tablet Take 1 tablet by mouth every 12  (twelve) hours. 10/27/18   Cathie HoopsYu, Jordani Nunn V, PA-C  cetirizine (ZYRTEC) 10 MG tablet Take 10 mg by mouth daily.    [provider]  diphenhydrAMINE (BENADRYL) 25 MG tablet Take 2 tablets (50 mg total) by mouth every 4 (four) hours as needed for itching or allergies. 09/18/15   Street, [place], PA-C  ibuprofen (ADVIL,MOTRIN) 200 MG tablet Take 600 mg by mouth every 6 (six) hours as needed for moderate pain.    [provider]  methylPREDNISolone (MEDROL DOSEPAK) 4 MG TBPK tablet Follow pack direction 10/27/18   Cathie HoopsYu, Kia Varnadore V, PA-C  ranitidine (ZANTAC) 150 MG tablet Take 1 tablet (150 mg total) by mouth 2 (two) times daily. Take until the rash/swelling resolves Patient not taking: Reported on 04/08/2017 09/18/15 10/13/18  Street, [place], PA-C    Family History Family History  Problem Relation Age of Onset  . Hypertension Mother   . Edema Sister        leg     Social History Social History   Tobacco Use  . Smoking status: Never Smoker  . Smokeless tobacco: Never Used  Substance Use Topics  . Alcohol use: No  . Drug use: No     Allergies   Keflex [cephalexin]   Review of Systems Review of Systems  Reason unable to perform ROS: See HPI as above.     Physical Exam Triage Vital Signs ED Triage Vitals [10/27/18 1026]  Enc Vitals Group     BP (!) 180/94  Pulse Rate (!) 120     Resp 20     Temp 98.9 F (37.2 C)     Temp Source Oral     SpO2 97 %     Weight      Height      Head Circumference      Peak Flow      Pain Score 6     Pain Loc      Pain Edu?      Excl. in [place]?    No data found.  Updated Vital Signs BP (!) 180/94 (BP Location: Left Arm)   Pulse (!) 120   Temp 98.9 F (37.2 C) (Oral)   Resp 20   LMP 10/14/2018   SpO2 97%   Physical Exam Constitutional:      General: She is not in acute distress.    Appearance: She is well-developed. She is not diaphoretic.  HENT:     Head: Normocephalic and atraumatic.     Mouth/Throat:     Mouth: Mucous  membranes are moist.     Pharynx: Oropharynx is clear. Uvula midline.  Eyes:     Conjunctiva/sclera: Conjunctivae normal.     Pupils: Pupils are equal, round, and reactive to light.  Pulmonary:     Effort: Pulmonary effort is normal. No respiratory distress.  Genitourinary:    Comments: Erythema with warmth and induration to the left buttock medially. No swelling, erythema, warmth to the rectum. No tenderness to palpation of the rectum. Skin:    Comments: See picture below. Significant hives throughout trunk, face. No tenderness.   Neurological:     Mental Status: She is alert and oriented to person, place, and time.            UC Treatments / Results  Labs (all labs ordered are listed, but only abnormal results are displayed) Labs Reviewed - No data to display  EKG   Radiology No results found.  Procedures Procedures (including critical care time)  Medications Ordered in UC Medications  dexamethasone (DECADRON) injection 10 mg (10 mg Intramuscular Given 10/27/18 1118)    Initial Impression / Assessment and Plan / UC Course  I have reviewed the triage vital signs and the nursing notes.  Pertinent labs & imaging results that were available during my care of the patient were reviewed by me and considered in my medical decision making (see chart for details).    Will cover for cellulitis of the left buttock with Augmentin.  Patient understands that given possible allergies to Keflex, Augmentin may cause reaction, and would like to proceed.  Will provide Decadron injection in office, and start course of Medrol pack to help with significant hives.  Strict return precautions given.  Otherwise patient to follow-up with PCP in 1 week for recheck and further evaluation of symptoms.  Patient expresses understanding and agrees to plan.  Final Clinical Impressions(s) / UC Diagnoses   Final diagnoses:  Cellulitis of buttock  Hives    ED Prescriptions    Medication Sig  Dispense Auth. Provider   amoxicillin-clavulanate (AUGMENTIN) 875-125 MG tablet Take 1 tablet by mouth every 12 (twelve) hours. 14 tablet Keily Lepp V, PA-C   methylPREDNISolone (MEDROL DOSEPAK) 4 MG TBPK tablet Follow pack direction 21 tablet Tobin Chad, [place] 10/27/18 1337

## 2018-10-27 NOTE — ED Triage Notes (Signed)
Pt states hx of CVI, states had a flare up 2wks ago and tx'd through video visit. C/o reaction to buttocks, pain, burning and itching. Swelling and hives to arms and face.

## 2018-10-27 NOTE — Discharge Instructions (Signed)
Decadron given in office today.  Start Augmentin and Medrol Dosepak as directed.  Continue Zyrtec/Benadryl to help with allergic reaction.  Avoid scratching.  As discussed, make appointment with PCP within 1 week for recheck.  If experiencing swelling to throat, trouble breathing, trouble swallowing, drooling, go to the emergency department for further evaluation.

## 2018-11-06 ENCOUNTER — Ambulatory Visit (INDEPENDENT_AMBULATORY_CARE_PROVIDER_SITE_OTHER): Payer: BC Managed Care – PPO | Admitting: Family Medicine

## 2018-11-06 ENCOUNTER — Other Ambulatory Visit: Payer: Self-pay

## 2018-11-06 ENCOUNTER — Encounter: Payer: Self-pay | Admitting: Family Medicine

## 2018-11-06 VITALS — BP 154/91 | HR 106 | Temp 97.3°F | Resp 17 | Wt 141.2 lb

## 2018-11-06 DIAGNOSIS — L302 Cutaneous autosensitization: Secondary | ICD-10-CM

## 2018-11-06 DIAGNOSIS — R21 Rash and other nonspecific skin eruption: Secondary | ICD-10-CM

## 2018-11-06 MED ORDER — METHYLPREDNISOLONE 4 MG PO TBPK: ORAL_TABLET | ORAL | 0 refills | Status: DC

## 2018-11-06 NOTE — Progress Notes (Signed)
States that the allergic reaction got better with the Prednisone & antibiotic but once she finished the medications the reaction started getting worse.

## 2018-11-06 NOTE — Progress Notes (Signed)
Subjective:  Patient ID: [name], female    DOB: [birth year]  Age: 43 y.o. MRN: [id number]  CC: Establish Care and Follow-up   HPI ILIZA BLANKENBECKLER is a 43 year old female with a history of chronic venous insufficiency who presents today for a follow up visit for evaluation of a generalized rash. One month ago she was managed at the ED for rectal discomfort and itching treated with Doxycycline, 2 days later she represented to the ED with facial edema, hives, generalized rash diagnosed an allergic reaction and treated with Prednisone taper and Benadryl. A week later symptoms worsened after she completed her course of Prednisone which led her to present again to the ED and was treated with Augmenetin and Prednisone taper.  Today the edema around her eyes have improved significantly but rash still persist in her lower back, anterior and posterior trunk arms, residual periorbital swelling but rash is absent on her legs. She denies lip edema, dyspnea. 3 years ago she had a similar experience after she had cellulitis of her lower extremities from scratching and this was treated with Keflex but episode was not as severe. 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   Past Medical History:  Diagnosis Date  . Varicose veins of both lower extremities 09/19/2015  . Venous stasis dermatitis of right lower extremity 10/08/2015    History reviewed. No pertinent surgical history.  Family History  Problem Relation Age of Onset  . Hypertension Mother   . Edema Sister        leg     Allergies  Allergen Reactions  . Keflex [Cephalexin] Hives    Outpatient Medications Prior to Visit  Medication Sig Dispense Refill  . diphenhydrAMINE (BENADRYL) 25 MG tablet Take 2 tablets (50 mg total) by mouth every 4 (four)  hours as needed for itching or allergies. 60 tablet 0  . amoxicillin-clavulanate (AUGMENTIN) 875-125 MG tablet Take 1 tablet by mouth every 12 (twelve) hours. 14 tablet 0  . cetirizine (ZYRTEC) 10 MG tablet Take 10 mg by mouth daily.    Marland Kitchen ibuprofen (ADVIL,MOTRIN) 200 MG tablet Take 600 mg by mouth every 6 (six) hours as needed for moderate pain.    . methylPREDNISolone (MEDROL DOSEPAK) 4 MG TBPK tablet Follow pack direction 21 tablet 0   No facility-administered medications prior to visit.      ROS Review of Systems  Constitutional: Negative for activity change, appetite change and fatigue.  HENT: Negative for congestion, sinus pressure and sore throat.   Eyes: Negative for visual disturbance.  Respiratory: Negative for cough, chest tightness, shortness of breath and wheezing.   Cardiovascular: Negative for chest pain and palpitations.  Gastrointestinal: Negative for abdominal distention, abdominal pain and constipation.  Endocrine: Negative for polydipsia.  Genitourinary: Negative for dysuria and frequency.  Musculoskeletal: Negative for arthralgias and back pain.  Skin: Positive for rash.  Neurological: Negative for tremors, light-headedness and numbness.  Hematological: Does not bruise/bleed easily.  Psychiatric/Behavioral: Negative for agitation and behavioral problems.    Objective:  BP (!) 154/91   Pulse (!) 106   Temp (!) 97.3 F (36.3 C) (Temporal)   Resp 17   Wt 141 lb 3.2 oz (64 kg)   LMP 10/14/2018   SpO2 97%   BMI 24.24  kg/m   BP/Weight 11/06/2018 10/27/2018 46/96/2952  Systolic BP 841 324 401  Diastolic BP 91 94 99  Wt. (Lbs) 141.2 - 141  BMI 24.24 - 24.2      Physical Exam Constitutional:      Appearance: She is well-developed.  Cardiovascular:     Rate and Rhythm: Normal rate.     Heart sounds: Normal heart sounds. No murmur.  Pulmonary:     Effort: Pulmonary effort is normal.     Breath sounds: Normal breath sounds. No wheezing or rales.  Chest:      Chest wall: No tenderness.  Abdominal:     General: Bowel sounds are normal. There is no distension.     Palpations: Abdomen is soft. There is no mass.     Tenderness: There is no abdominal tenderness.  Musculoskeletal: Normal range of motion.  Skin:    Comments: Coarse erythematous rash around neck, anterior and posterior trunk, lower back, upper extremities, periorbital edema and rash. Rash spares the lower extremities Medical aspect of bilateral gluteal region with hyperpigmented rash surrounded by erythhema.   Neurological:     Mental Status: She is alert and oriented to person, place, and time.     CMP Latest Ref Rng & Units 04/09/2017 04/02/2016 09/28/2015  Glucose 65 - 99 mg/dL 89 86 91  BUN 6 - 24 mg/dL '15 12 7  '$ Creatinine 0.57 - 1.00 mg/dL 0.85 0.79 0.77  Sodium 134 - 144 mmol/L 138 138 136  Potassium 3.5 - 5.2 mmol/L 4.2 3.5 3.8  Chloride 96 - 106 mmol/L 104 102 105  CO2 20 - 29 mmol/L '21 26 23  '$ Calcium 8.7 - 10.2 mg/dL 9.0 9.1 9.0  Total Protein 6.0 - 8.5 g/dL 7.6 7.6 7.2  Total Bilirubin 0.0 - 1.2 mg/dL 0.3 0.3 0.4  Alkaline Phos 39 - 117 IU/L 68 61 64  AST 0 - 40 IU/L '18 20 27  '$ ALT 0 - 32 IU/L '12 11 14    '$ Lipid Panel     Component Value Date/Time   CHOL 211 (H) 04/02/2016 1404   TRIG 59 04/02/2016 1404   HDL 102 04/02/2016 1404   CHOLHDL 2.1 04/02/2016 1404   VLDL 12 04/02/2016 1404   LDLCALC 97 04/02/2016 1404    CBC    Component Value Date/Time   WBC 4.9 04/09/2017 0811   WBC 4.6 04/02/2016 1404   RBC 3.92 04/09/2017 0811   RBC 4.20 04/02/2016 1404   HGB 10.8 (L) 04/09/2017 0811   HCT 35.1 04/09/2017 0811   PLT 228 04/09/2017 0811   MCV 90 04/09/2017 0811   MCH 27.6 04/09/2017 0811   MCH 28.6 04/02/2016 1404   MCHC 30.8 (L) 04/09/2017 0811   MCHC 32.0 04/02/2016 1404   RDW 14.9 04/09/2017 0811   LYMPHSABS 1.8 04/09/2017 0811   MONOABS 460 04/02/2016 1404   EOSABS 0.1 04/09/2017 0811   BASOSABS 0.0 04/09/2017 0811    Lab Results   Component Value Date   HGBA1C 4.8 04/02/2016    Assessment & Plan:   1. Id reaction Seems to have ben trggerred by a form of cellulitis as she had a similar episode 3 years ago after lower extremity cellulitis Will place on medrol dose pack again - Ambulatory referral to Allergy - CMP14+EGFR - CBC with Differential/Platelet - Allergy Panel 18, [employer] - Allergy Panel 19, [employer] - Food Allergy Profile    Meds ordered this encounter  Medications  . methylPREDNISolone (MEDROL DOSEPAK)  4 MG TBPK tablet    Sig: Follow pack direction    Dispense:  21 tablet    Refill:  0    Follow-up: Return in about 3 weeks (around 11/27/2018) for follow up on rash.       Charlott Rakes, MD, FAAFP. [facility] [place], [place]   11/06/2018, 12:08 PM

## 2018-11-06 NOTE — Patient Instructions (Signed)
Anaphylactic Reaction, Adult An anaphylactic reaction (anaphylaxis) is a sudden, serious allergic reaction. This affects more than one part of your body. It can be life-threatening. If you have an anaphylactic reaction, you need to get medical help right away. What are the causes? This condition is caused by exposure to things that give you an allergic reaction (allergens). Common allergens include:  Foods, such as peanuts, wheat, shellfish, milk, and eggs.  Medicines.  Insect bites or stings.  Blood or parts of blood received for treatment (transfusions).  Chemicals, such as latex and dyes that are used in food and in medical tests. What are the signs or symptoms? Signs of an anaphylactic reaction may include:  Feeling warm in the face (flushed). Your face may turn red.  Itchy, red, swollen areas of skin (hives).  Swelling of the: ? Eyes. ? Lips. ? Face. ? Mouth. ? Tongue. ? Throat.  Trouble with any of these: ? Breathing. ? Talking. ? Swallowing.  Loud breathing (wheezing).  Feeling dizzy or light-headed.  Passing out (fainting).  Pain or cramps in your belly.  Throwing up (vomiting).  Watery poop (diarrhea). How is this diagnosed? This condition is diagnosed based on:  Your symptoms.  A physical exam.  Blood tests.  Recent exposure to things that give you an allergic reaction. How is this treated? If you think you are having an anaphylactic reaction, you should do this right away:  Give yourself a shot of medicine (epinephrine) using an auto-injector "pen." Your doctor will teach you how to use this pen.  Call for emergency help. If you use a pen, you must still get treated in the hospital. There, you may be given: ? Medicines. ? Oxygen. ? Fluids in an IV tube. Follow these instructions at home: Safety  Always keep an auto-injector pen with you. This could save your life. Use it as told by your doctor.  Do not drive after a reaction. Wait until  your doctor says it is safe to drive.  Make sure that you, the people who live with you, and your employer know: ? What you are allergic to, so you can stay away from it. ? How to use your auto-injector pen.  Wear a bracelet or necklace that says you have an allergy, if your doctor tells you to do this.  Learn the signs of a very bad allergic reaction. This way, you can treat it right away.  Work with your doctors to make a plan for what to do if you have a very bad reaction. It is important to be ready. If you use your auto-injector pen:   Get more medicine (epinephrine) for your pen right away. This is important in case you have another reaction.  Get help right away. To avoid a serious allergic reaction:  Avoid things that gave you a very bad allergic reaction before.  Tell your server about your allergy when you go out to eat. If you are not sure if your meal has food that you are allergic to, ask your server before you eat it. General instructions  Take over-the-counter and prescription medicines only as told by your doctor.  If you have itchy, red, swollen areas of skin or a rash: ? Use an over-the-counter medicine (antihistamine) as told by your doctor. ? Put cold, wet cloths on your skin. ? Take a cool bath or shower. Avoid hot water.  Tell all doctors who care for you that you have an allergy.  Keep all follow-up visits  as told by your doctor. This is important. Get help right away if:  You have signs of an allergic reaction. You may notice them soon after being exposed to things that give you an allergic reaction. Signs may include: ? Warmth in your face. Your face may turn red. ? Itchy, red, swollen areas of skin. ? Swelling of your:  Eyes.  Lips.  Face.  Mouth.  Tongue.  Throat. ? Trouble with any of these:  Breathing.  Talking.  Swallowing. ? Loud breathing (wheezing). ? Feeling dizzy or light-headed. ? Passing out. ? Pain or cramps in your  belly. ? Throwing up. ? Watery poop.  You had to use your auto-injector pen. You must go to the emergency room even if the medicine seems to be working. This is because another allergic reaction may happen within 3 days (rebound anaphylaxis). These symptoms may be an emergency. Do not wait to see if the symptoms will go away. Do this right away:  Use your auto-injector pen as you have been told.  Get medical help. Call your local emergency services (911 in the [place] Do not drive yourself to the hospital. Summary  An anaphylactic reaction (anaphylaxis) is a sudden, serious allergic reaction.  This condition can be life-threatening. If you have a reaction, get medical help right away.  Your doctor will show you how to give yourself a shot (epinephrine injection) with an auto-injector "pen."  Always keep an auto-injector pen with you. It could save your life. Use it as told by your doctor.  If you had to use your auto-injector pen, you must go to the emergency room. Go there even if the medicine seems to be working. This information is not intended to replace advice given to you by your health care provider. Make sure you discuss any questions you have with your health care provider. Document Released: 08/29/2007 Document Revised: 07/04/2017 Document Reviewed: 07/04/2017 Elsevier Patient Education  2020 [employer].

## 2018-11-08 LAB — CBC WITH DIFFERENTIAL/PLATELET
Basophils Absolute: 0 10*3/uL (ref 0.0–0.2)
Basos: 1 %
EOS (ABSOLUTE): 0.6 10*3/uL — ABNORMAL HIGH (ref 0.0–0.4)
Eos: 10 %
Hematocrit: 37 % (ref 34.0–46.6)
Hemoglobin: 11.7 g/dL (ref 11.1–15.9)
Immature Grans (Abs): 0 10*3/uL (ref 0.0–0.1)
Immature Granulocytes: 0 %
Lymphocytes Absolute: 1.6 10*3/uL (ref 0.7–3.1)
Lymphs: 29 %
MCH: 26.2 pg — ABNORMAL LOW (ref 26.6–33.0)
MCHC: 31.6 g/dL (ref 31.5–35.7)
MCV: 83 fL (ref 79–97)
Monocytes Absolute: 0.6 10*3/uL (ref 0.1–0.9)
Monocytes: 11 %
Neutrophils Absolute: 2.8 10*3/uL (ref 1.4–7.0)
Neutrophils: 49 %
Platelets: 242 10*3/uL (ref 150–450)
RBC: 4.46 x10E6/uL (ref 3.77–5.28)
RDW: 14.8 % (ref 11.7–15.4)
WBC: 5.6 10*3/uL (ref 3.4–10.8)

## 2018-11-08 LAB — CMP14+EGFR
ALT: 13 IU/L (ref 0–32)
AST: 21 IU/L (ref 0–40)
Albumin/Globulin Ratio: 1.3 (ref 1.2–2.2)
Albumin: 4.7 g/dL (ref 3.8–4.8)
Alkaline Phosphatase: 84 IU/L (ref 39–117)
BUN/Creatinine Ratio: 12 (ref 9–23)
BUN: 11 mg/dL (ref 6–24)
Bilirubin Total: 0.3 mg/dL (ref 0.0–1.2)
CO2: 21 mmol/L (ref 20–29)
Calcium: 9.7 mg/dL (ref 8.7–10.2)
Chloride: 100 mmol/L (ref 96–106)
Creatinine, Ser: 0.92 mg/dL (ref 0.57–1.00)
GFR calc Af Amer: 87 mL/min/{1.73_m2} (ref >59)
GFR calc non Af Amer: 75 mL/min/{1.73_m2} (ref >59)
Globulin, Total: 3.5 g/dL (ref 1.5–4.5)
Glucose: 87 mg/dL (ref 65–99)
Potassium: 4.1 mmol/L (ref 3.5–5.2)
Sodium: 138 mmol/L (ref 134–144)
Total Protein: 8.2 g/dL (ref 6.0–8.5)

## 2018-11-08 LAB — FOOD ALLERGY PROFILE
Allergen Corn, IgE: <0.1 kU/L
Clam IgE: <0.1 kU/L
Codfish IgE: <0.1 kU/L
Egg White IgE: <0.1 kU/L
Milk IgE: <0.1 kU/L
Peanut IgE: <0.1 kU/L
Scallop IgE: <0.1 kU/L
Sesame Seed IgE: <0.1 kU/L
Shrimp IgE: <0.1 kU/L
Soybean IgE: <0.1 kU/L
Walnut IgE: <0.1 kU/L
Wheat IgE: <0.1 kU/L

## 2018-11-08 LAB — ALLERGY PANEL 19, SEAFOOD GROUP
Allergen Salmon IgE: <0.1 kU/L
Catfish: <0.1 kU/L
F023-IgE Crab: <0.1 kU/L
F080-IgE Lobster: <0.1 kU/L
Tuna: <0.1 kU/L

## 2018-11-08 LAB — ALLERGY PANEL 18, NUT MIX GROUP
Allergen Coconut IgE: <0.1 kU/L
F020-IgE Almond: <0.1 kU/L
F202-IgE Cashew Nut: <0.1 kU/L
Hazelnut (Filbert) IgE: <0.1 kU/L
Pecan Nut IgE: <0.1 kU/L

## 2018-11-17 ENCOUNTER — Telehealth: Payer: Self-pay | Admitting: Family Medicine

## 2018-11-17 MED ORDER — METHYLPREDNISOLONE 4 MG PO TBPK: ORAL_TABLET | ORAL | 0 refills | Status: DC

## 2018-11-17 NOTE — Addendum Note (Signed)
Addended by: Charlott Rakes on: 11/17/2018 05:34 PM   Modules accepted: Orders

## 2018-11-17 NOTE — Telephone Encounter (Signed)
I have refilled it. She was referred to the allergist and they should be in touch with her soon.

## 2018-11-17 NOTE — Telephone Encounter (Signed)
Refill not appropriate

## 2018-11-17 NOTE — Telephone Encounter (Signed)
Pt called to request a refill on -methylPREDNISolone (MEDROL DOSEPAK) 4 MG TBPK tablet  She says she has been taking this medication and it is helping her infection. It has not cleared out completely and she has ran out. Please follow up to  -[place], [place]

## 2018-11-26 ENCOUNTER — Telehealth: Payer: Self-pay

## 2018-11-26 NOTE — Telephone Encounter (Signed)

## 2018-11-27 ENCOUNTER — Ambulatory Visit: Payer: BC Managed Care – PPO

## 2018-11-27 ENCOUNTER — Other Ambulatory Visit: Payer: Self-pay

## 2018-11-27 ENCOUNTER — Ambulatory Visit (INDEPENDENT_AMBULATORY_CARE_PROVIDER_SITE_OTHER): Payer: PRIVATE HEALTH INSURANCE | Admitting: Family Medicine

## 2018-11-27 VITALS — BP 149/84 | HR 81 | Temp 97.5°F | Resp 17 | Ht 65.0 in | Wt 139.4 lb

## 2018-11-27 DIAGNOSIS — Z1159 Encounter for screening for other viral diseases: Secondary | ICD-10-CM

## 2018-11-27 DIAGNOSIS — L302 Cutaneous autosensitization: Secondary | ICD-10-CM

## 2018-11-27 DIAGNOSIS — R21 Rash and other nonspecific skin eruption: Secondary | ICD-10-CM | POA: Diagnosis not present

## 2018-11-27 MED ORDER — CLOTRIMAZOLE 1 % EX CREA: 1.0000 "application " | TOPICAL_CREAM | Freq: Two times a day (BID) | CUTANEOUS | 1 refills | Status: DC

## 2018-11-27 NOTE — Progress Notes (Signed)
Patient states that she noticed improvement while on the prednisone. Once she finished the Prednisone both times she felt like her symptoms starting returning. Describes it as prickly sharp pains in her skin.

## 2018-11-27 NOTE — Progress Notes (Signed)
Subjective:  Patient ID: [name], female    DOB: 1976-03-12  Age: 43 y.o. MRN: [id number]  CC: Rash   HPI [name] is a 43 year old female with a history of chronic venous insufficiency who presents today for a follow up visit for evaluation of a generalized rash.  From 11/06/18 One month ago she was managed at the ED for rectal discomfort and itching treated with Doxycycline, 2 days later she represented to the ED with facial edema, hives, generalized rash diagnosed an allergic reaction and treated with Prednisone taper and Benadryl. A week later symptoms worsened after she completed her course of Prednisone which led her to present again to the ED and was treated with Augmenetin and Prednisone taper.  Today the edema around her eyes have improved significantly but rash still persist in her lower back, anterior and posterior trunk arms, residual periorbital swelling but rash is absent on her legs. She denies lip edema, dyspnea. 3 years ago she had a similar experience after she had cellulitis of her lower extremities from scratching and this was treated with Keflex but episode was not as severe. 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  Today 11/26/18 She required another refill of Prednisone due to the return of her symptoms after completing her last course evidenced by facial edema and generalized prickly sensation. She is now 4 days off Prednisone and truncal rash has cleared, she has no facial edema, perirectal rash is improving but still itchy; denies fever. She has modified her diet and is taking Tumeric capsules and L-lysine. She has an upcoming appointment with the Allergist next week.  Past Medical History:  Diagnosis Date  . Varicose veins of both lower extremities 09/19/2015  .  Venous stasis dermatitis of right lower extremity 10/08/2015    History reviewed. No pertinent surgical history.  Family History  Problem Relation Age of Onset  . Hypertension Mother   . Edema Sister        leg     Allergies  Allergen Reactions  . Keflex [Cephalexin] Hives    Outpatient Medications Prior to Visit  Medication Sig Dispense Refill  . diphenhydrAMINE (BENADRYL) 25 MG tablet Take 2 tablets (50 mg total) by mouth every 4 (four) hours as needed for itching or allergies. 60 tablet 0  . methylPREDNISolone (MEDROL DOSEPAK) 4 MG TBPK tablet Follow pack direction 21 tablet 0   No facility-administered medications prior to visit.      ROS Review of Systems  Constitutional: Negative for activity change, appetite change and fatigue.  HENT: Negative for congestion, sinus pressure and sore throat.   Eyes: Negative for visual disturbance.  Respiratory: Negative for cough, chest tightness, shortness of breath and wheezing.   Cardiovascular: Negative for chest pain and palpitations.  Gastrointestinal: Negative for abdominal distention, abdominal pain and constipation.  Endocrine: Negative for polydipsia.  Genitourinary: Negative for dysuria and frequency.  Musculoskeletal: Negative for arthralgias and back pain.  Skin: Positive for rash (gluteal cleft).  Neurological: Negative for tremors, light-headedness and numbness.  Hematological: Does not bruise/bleed easily.  Psychiatric/Behavioral: Negative for agitation and behavioral problems.    Objective:  BP (!) 149/84   Pulse 81   Temp (!) 97.5 F (36.4 C) (Temporal)   Resp 17  Ht 5\' 5"  (1.651 m)   Wt 139 lb 6.4 oz (63.2 kg)   SpO2 98%   BMI 23.20 kg/m   BP/Weight 11/27/2018 04/03/3233 07/31/3218  Systolic BP 254 270 623  Diastolic BP 84 91 94  Wt. (Lbs) 139.4 141.2 -  BMI 23.2 24.24 -      Physical Exam Constitutional:      Appearance: She is well-developed.  Cardiovascular:     Rate and Rhythm: Normal rate.      Heart sounds: Normal heart sounds. No murmur.  Pulmonary:     Effort: Pulmonary effort is normal.     Breath sounds: Normal breath sounds. No wheezing or rales.  Chest:     Chest wall: No tenderness.  Abdominal:     General: Bowel sounds are normal. There is no distension.     Palpations: Abdomen is soft. There is no mass.     Tenderness: There is no abdominal tenderness.  Genitourinary:    Comments: Slight weeping of anterior aspect of gluteal cleft which has improved significantly compared to previous Musculoskeletal: Normal range of motion.  Skin:    Comments: Residual scars on upper right thorax  Residual hyperpigmentation on trunk from resolved rash  Neurological:     Mental Status: She is alert and oriented to person, place, and time.     CMP Latest Ref Rng & Units 11/06/2018 04/09/2017 04/02/2016  Glucose 65 - 99 mg/dL 87 89 86  BUN 6 - 24 mg/dL 11 15 12   Creatinine 0.57 - 1.00 mg/dL 0.92 0.85 0.79  Sodium 134 - 144 mmol/L 138 138 138  Potassium 3.5 - 5.2 mmol/L 4.1 4.2 3.5  Chloride 96 - 106 mmol/L 100 104 102  CO2 20 - 29 mmol/L 21 21 26   Calcium 8.7 - 10.2 mg/dL 9.7 9.0 9.1  Total Protein 6.0 - 8.5 g/dL 8.2 7.6 7.6  Total Bilirubin 0.0 - 1.2 mg/dL 0.3 0.3 0.3  Alkaline Phos 39 - 117 IU/L 84 68 61  AST 0 - 40 IU/L 21 18 20   ALT 0 - 32 IU/L 13 12 11     Lipid Panel     Component Value Date/Time   CHOL 211 (H) 04/02/2016 1404   TRIG 59 04/02/2016 1404   HDL 102 04/02/2016 1404   CHOLHDL 2.1 04/02/2016 1404   VLDL 12 04/02/2016 1404   LDLCALC 97 04/02/2016 1404    CBC    Component Value Date/Time   WBC 5.6 11/06/2018 1126   WBC 4.6 04/02/2016 1404   RBC 4.46 11/06/2018 1126   RBC 4.20 04/02/2016 1404   HGB 11.7 11/06/2018 1126   HCT 37.0 11/06/2018 1126   PLT 242 11/06/2018 1126   MCV 83 11/06/2018 1126   MCH 26.2 (L) 11/06/2018 1126   MCH 28.6 04/02/2016 1404   MCHC 31.6 11/06/2018 1126   MCHC 32.0 04/02/2016 1404   RDW 14.8 11/06/2018 1126    LYMPHSABS 1.6 11/06/2018 1126   MONOABS 460 04/02/2016 1404   EOSABS 0.6 (H) 11/06/2018 1126   BASOSABS 0.0 11/06/2018 1126    Lab Results  Component Value Date   HGBA1C 4.8 04/02/2016    Assessment & Plan:   1. Rash and nonspecific skin eruption Improved gluteal cleft rash Will place on topical antifungal Keep area dry - HSV Type I/II IgG, IgMw/ reflex - clotrimazole (LOTRIMIN) 1 % cream; Apply 1 application topically 2 (two) times daily.  Dispense: 30 g; Refill: 1  2. Id reaction Improved with courses of Prednisone  Unknown trigger Keep appointment with Allergist  3. Screening for viral disease - HSV Type I/II IgG, IgMw/ reflex - HIV Antibody (routine testing w rflx)    Meds ordered this encounter  Medications  . clotrimazole (LOTRIMIN) 1 % cream    Sig: Apply 1 application topically 2 (two) times daily.    Dispense:  30 g    Refill:  1    Follow-up: Return in about 3 months (around 02/26/2019) for coordination of care.       Hoy RegisterEnobong Naol Ontiveros, MD, FAAFP. [facility] [place], [place] [phone]   11/27/2018, 11:07 AM

## 2018-12-02 ENCOUNTER — Ambulatory Visit: Payer: Self-pay | Admitting: Allergy & Immunology

## 2018-12-02 LAB — HSV TYPE I/II IGG, IGMW/ REFLEX
HSV 1 Glycoprotein G Ab, IgG: <0.91 index (ref 0.00–0.90)
HSV 1 IgM: <1:10 {titer}
HSV 2 IgG, Type Spec: <0.91 index (ref 0.00–0.90)
HSV 2 IgM: <1:10 {titer}

## 2018-12-02 LAB — HIV ANTIBODY (ROUTINE TESTING W REFLEX): HIV Screen 4th Generation wRfx: NONREACTIVE

## 2018-12-18 ENCOUNTER — Encounter: Payer: Self-pay | Admitting: Allergy & Immunology

## 2018-12-18 ENCOUNTER — Other Ambulatory Visit: Payer: Self-pay

## 2018-12-18 ENCOUNTER — Ambulatory Visit (INDEPENDENT_AMBULATORY_CARE_PROVIDER_SITE_OTHER): Payer: PRIVATE HEALTH INSURANCE | Admitting: Allergy & Immunology

## 2018-12-18 VITALS — BP 138/82 | HR 101 | Temp 97.6°F | Resp 16 | Ht 64.5 in | Wt 143.8 lb

## 2018-12-18 DIAGNOSIS — L239 Allergic contact dermatitis, unspecified cause: Secondary | ICD-10-CM | POA: Diagnosis not present

## 2018-12-18 DIAGNOSIS — L508 Other urticaria: Secondary | ICD-10-CM | POA: Diagnosis not present

## 2018-12-18 NOTE — Patient Instructions (Addendum)
1. Chronic urticaria - The pictures look consistent with urticaria (hives), so we are going to do an extensive workup. - We are going to get the following Alpha-Gal Panel, ANA w/Reflex if Positive, Chronic Urticaria, C-reactive protein, Rheumatoid factor, Sedimentation rate, Thyroid antibodies, and Tryptase. - Start taking Zyrtec (cetirizine) 10mg  once daily to try keep the itching/hies under control.   2. Allergic contact dermatitis - Consider doing patch testing to look for chemical sensitivities. - You could even bring in your cosmetic products that you think might be triggering your symptoms.  - This takes a visit on a Monday, Wednesday, and a Friday. - You need to be off of prednisone for four weeks before doing this.    True Test looks for the following sensitivities:      3. Return in about 4 weeks (around 01/15/2019). This can be an in-person, a virtual Webex or a telephone follow up visit.   Please inform us of any Emergency Department visits, hospitalizations, or changes in symptoms. Call us before going to the ED for breathing or allergy symptoms since we might be able to fit you in for a sick visit. Feel free to contact us anytime with any questions, problems, or concerns.  It was a pleasure to meet you today!  Websites that have reliable patient information: 1. American Academy of Asthma, Allergy, and Immunology: www.aaaai.org 2. Food Allergy Research and Education (FARE): foodallergy.org 3. Mothers of Asthmatics: http://www.asthmacommunitynetwork.org 4. American College of Allergy, Asthma, and Immunology: www.acaai.org  "Like" [place] on Facebook and Instagram for our latest updates!      Make sure you are registered to vote! If you have moved or changed any of your contact information, you will need to get this updated before voting!  In some cases, you MAY be able to register to vote online: [email or web address]    Voter ID laws are NOT  going into effect for the General Election in November 2020! DO NOT let this stop you from exercising your right to vote!   Absentee voting is the SAFEST way to vote during the coronavirus pandemic!   Download and print an absentee ballot request form at [email or web address] or you can scan the QR code below with your smart phone:      More information on absentee ballots can be found here: [email or web address]

## 2018-12-18 NOTE — Progress Notes (Signed)
NEW PATIENT  Date of Service/Encounter:  12/18/18  Referring provider: Hoy RegisterNewlin, Enobong, MD   Assessment:   Chronic urticaria - with negative testing to environmental allergens today  Allergic contact dermatitis  Eosinophilia on a recent CBC  Negative food testing (via IgE panels)  Plan/Recommendations:    1. Chronic urticaria - The pictures look consistent with urticaria (hives), so we are going to do an extensive workup. - We are going to get the following Alpha-Gal Panel, ANA w/Reflex if Positive, Chronic Urticaria, C-reactive protein, Rheumatoid factor, Sedimentation rate, Thyroid antibodies, and Tryptase. - Start taking Zyrtec (cetirizine) 10mg  once daily to try keep the itching/hies under control.   2. Allergic contact dermatitis - Consider doing patch testing to look for chemical sensitivities. - You could even bring in your cosmetic products that you think might be triggering your symptoms.  - This takes a visit on a Monday, Wednesday, and a Friday. - You need to be off of prednisone for four weeks before doing this.   3. Return in about 4 weeks (around 01/15/2019). This can be an in-person, a virtual Webex or a telephone follow up visit.   Subjective:   Alison [name] is a 43 y.o. female presenting today for evaluation of  Chief Complaint  Patient presents with  . Rash    breaking out feeling itchy, mostly in buttocks area,   . Allergic Reaction    2 months ago  . Angioedema    face was swollen,   . Urticaria    from the groin up, except her vaginal area, everywhere else, had weeping hives    [name] has a history of the following: Patient Active Problem List   Diagnosis Date Noted  . Venous stasis dermatitis of right lower extremity 10/08/2015  . Irritant contact dermatitis due to chemical 10/08/2015  . Varicose veins of leg with edema 09/19/2015    History obtained from: chart review and patient.  [name] was referred by Hoy RegisterNewlin, Enobong,  MD.     [name] is a 43 y.o. female presenting for an evaluation of a rash. This is a very convoluted story and somewhat difficult to follow.    She started having itching on her buttocks two months ago. She did a virtual visit and she was tentatively diagnosed with a hemorrhoid. This seemed to make sense initially since the itching was isolated to her rectal area. But then she started having itching all over and she felt that her buttock might be infected. She was given an antibiotic. However at the same time, she was also developing hives.   A similar episode occurred three years ago and she received prednisone with instant improvement in the hives. She was given Keflex and was already developing hives BEFORE the Keflex started. Therefore she would like Keflex removed.   She did have some hives over her body during this time. She did not have breathing problems with the hives at all. She has had prednisone four times in the last few months due to the hives. She was taking Benadryl, "popping them like Skittles" per the patient. Since then she has really done fine. In total, the hives lasted around two months. She thought it was an infection during that time, in part due to the reaction to the cellulitis three years ago.   She did have elevated eosinophils on a recent CBC. Food allergy testing was negative.   She is a Producer, television/film/videohair dresser. She does not have any reactions  to the products. Where she broke out the first time, she did do a color to her own hair. She works with a multitude of different chemicals in her line of work. She did recently change the line of products that she has been using.   Otherwise, there is no history of other atopic diseases, including asthma, food allergies, drug allergies, environmental allergies or stinging insect allergies. There is no significant infectious history. Vaccinations are up to date.    Past Medical History: Patient Active Problem List   Diagnosis Date Noted  .  Venous stasis dermatitis of right lower extremity 10/08/2015  . Irritant contact dermatitis due to chemical 10/08/2015  . Varicose veins of leg with edema 09/19/2015    Medication List:  Allergies as of 12/18/2018   No Active Allergies     Medication List       Accurate as of December 18, 2018  9:39 PM. If you have any questions, ask your nurse or doctor.        STOP taking these medications   diphenhydrAMINE 25 MG tablet Commonly known as: Benadryl Stopped by: Valentina Shaggy, MD     TAKE these medications   clotrimazole 1 % cream Commonly known as: LOTRIMIN Apply 1 application topically 2 (two) times daily.       Birth History: non-contributory  Developmental History: non-contributory  Past Surgical History: History reviewed. No pertinent surgical history.   Family History: Family History  Problem Relation Age of Onset  . Hypertension Mother   . Edema Sister        leg      Social History: [name] lives at home with her children. She did notice some mold recently behind her bed, but she has been in the same complex and same apartment for nearly 15 years. There are no animals in the home. She is not a smoker at all. She has her cosmetology shop in a place other than her own home.     Review of Systems  Constitutional: Negative.  Negative for chills, fever, malaise/fatigue and weight loss.  HENT: Negative.  Negative for congestion, ear discharge, ear pain and sore throat.   Eyes: Negative for pain, discharge and redness.  Respiratory: Negative for cough, sputum production, shortness of breath and wheezing.   Cardiovascular: Negative.  Negative for chest pain and palpitations.  Gastrointestinal: Negative for abdominal pain, constipation, diarrhea, heartburn, nausea and vomiting.  Skin: Positive for itching and rash.  Neurological: Negative for dizziness and headaches.  Endo/Heme/Allergies: Negative for environmental allergies. Does not bruise/bleed easily.        Objective:   Blood pressure 138/82, pulse (!) 101, temperature 97.6 F (36.4 C), temperature source Temporal, resp. rate 16, height 5' 4.5" (1.638 m), weight 143 lb 12.8 oz (65.2 kg), SpO2 98 %. Body mass index is 24.3 kg/m.   Physical Exam:   Physical Exam  Constitutional: She appears well-developed.  HENT:  Head: Normocephalic and atraumatic.  Right Ear: Tympanic membrane, external ear and ear canal normal. No drainage, swelling or tenderness. Tympanic membrane is not injected, not scarred, not erythematous, not retracted and not bulging.  Left Ear: Tympanic membrane, external ear and ear canal normal. No drainage, swelling or tenderness. Tympanic membrane is not injected, not scarred, not erythematous, not retracted and not bulging.  Nose: Mucosal edema present. No rhinorrhea, nasal deformity or septal deviation. No epistaxis. Right sinus exhibits no maxillary sinus tenderness and no frontal sinus tenderness. Left sinus exhibits no maxillary sinus tenderness  and no frontal sinus tenderness.  Mouth/Throat: Uvula is midline and oropharynx is clear and moist. Mucous membranes are not pale and not dry.  Oropharynx unremarkable. Uvula midline.   Eyes: Pupils are equal, round, and reactive to light. Conjunctivae and EOM are normal. Right eye exhibits no chemosis and no discharge. Left eye exhibits no chemosis and no discharge. Right conjunctiva is not injected. Left conjunctiva is not injected.  Cardiovascular: Normal rate, regular rhythm and normal heart sounds.  Respiratory: Effort normal and breath sounds normal. No accessory muscle usage. No tachypnea. No respiratory distress. She has no wheezes. She has no rhonchi. She has no rales. She exhibits no tenderness.  Moving air well in all lung fields. No increased work of breathing noted.   GI: There is no abdominal tenderness. There is no rebound and no guarding.  Lymphadenopathy:       Head (right side): No submandibular, no  tonsillar and no occipital adenopathy present.       Head (left side): No submandibular, no tonsillar and no occipital adenopathy present.    She has no cervical adenopathy.  Neurological: She is alert.  Skin: No abrasion, no petechiae and no rash noted. Rash is not papular, not vesicular and not urticarial. No erythema. No pallor.  Skin appears normal now, but she is s/p four rounds of prednisone. She does show me pictures of rashes that appear like hives in some of them, but there are some (especially on the face) that appears to be more contact dermatitis.   Psychiatric: She has a normal mood and affect.     Diagnostic studies:     Allergy Studies:    Airborne Adult Perc - 12/18/18 1601    Time Antigen Placed  1601    Allergen Manufacturer  Waynette Buttery    Location  Back    Number of Test  59    1. Control-Buffer 50% Glycerol  Negative    2. Control-Histamine 1 mg/ml  2+    3. Albumin saline  Negative    4. Bahia  Negative    5. French Southern Territ[place]  Negative    6. Johnson  Negative    7. Kentucky Blue  Negative    8. Meadow Fescue  Negative    9. Perennial Rye  Negative    10. Sweet Vernal  Negative    11. Timothy  Negative    12. Cocklebur  Negative    13. Burweed Marshelder  Negative    14. Ragweed, short  Negative    15. Ragweed, Giant  Negative    16. Plantain,  English  Negative    17. Lamb's Quarters  Negative    18. Sheep Sorrell  Negative    19. Rough Pigweed  Negative    20. Marsh Elder, Rough  Negative    21. Mugwort, Common  Negative    22. Ash mix  Negative    23. Birch mix  Negative    24. Beech American  Negative    25. Box, Elder  Negative    26. Cedar, red  Negative    27. Cottonwood, Guinea[place]Bissau  Negative    28. Elm mix  Negative    29. Hickory mix  Negative    30. Maple mix  Negative    31. Oak, Guinea-[place]issau mix  Negative    32. Pecan Pollen  Negative    33. Pine mix  Negative    34. Sycamore Eastern  Negative    35. Walnut, Black Pollen  Negative    36. Alternaria  alternata  Negative    37. Cladosporium Herbarum  Negative    38. Aspergillus mix  Negative    39. Penicillium mix  Negative    40. Bipolaris sorokiniana (Helminthosporium)  Negative    41. Drechslera spicifera (Curvularia)  Negative    42. Mucor plumbeus  Negative    43. Fusarium moniliforme  Negative    44. Aureobasidium pullulans (pullulara)  Negative    45. Rhizopus oryzae  Negative    46. Botrytis cinera  Negative    47. Epicoccum nigrum  Negative    48. Phoma betae  Negative    49. Candida Albicans  Negative    50. Trichophyton mentagrophytes  Negative    51. Mite, D Farinae  5,000 AU/ml  Negative    52. Mite, D Pteronyssinus  5,000 AU/ml  Negative    53. Cat Hair 10,000 BAU/ml  Negative    54.  Dog Epithelia  Negative    55. Mixed Feathers  Negative    56. Horse Epithelia  Negative    57. Cockroach, German  Negative    58. Mouse  Negative    59. Tobacco Leaf  Negative    Other  Omitted    Other  Omitted       Allergy testing results were read and interpreted by myself, documented by clinical staff.         Malachi Bonds, MD [facility]

## 2018-12-26 LAB — CHRONIC URTICARIA: cu index: 2.8 (ref <10)

## 2018-12-26 LAB — ALPHA-GAL PANEL
Alpha Gal IgE*: <0.1 kU/L (ref <0.10)
Beef (Bos spp) IgE: <0.1 kU/L (ref <0.35)
Class Interpretation: 0
Class Interpretation: 0
Class Interpretation: 0
Lamb/Mutton (Ovis spp) IgE: <0.1 kU/L (ref <0.35)
Pork (Sus spp) IgE: <0.1 kU/L (ref <0.35)

## 2018-12-26 LAB — THYROID ANTIBODIES
Thyroglobulin Antibody: <1 IU/mL (ref 0.0–0.9)
Thyroperoxidase Ab SerPl-aCnc: <9 IU/mL (ref 0–34)

## 2018-12-26 LAB — SEDIMENTATION RATE: Sed Rate: 83 mm/hr — ABNORMAL HIGH (ref 0–32)

## 2018-12-26 LAB — ANA W/REFLEX IF POSITIVE: Anti Nuclear Antibody (ANA): NEGATIVE

## 2018-12-26 LAB — RHEUMATOID FACTOR: Rheumatoid fact SerPl-aCnc: 10.8 IU/mL (ref 0.0–13.9)

## 2018-12-26 LAB — TRYPTASE: Tryptase: 6.3 ug/L (ref 2.2–13.2)

## 2018-12-26 LAB — C-REACTIVE PROTEIN: CRP: <1 mg/L (ref 0–10)

## 2019-01-15 ENCOUNTER — Encounter: Payer: Self-pay | Admitting: Allergy & Immunology

## 2019-01-15 ENCOUNTER — Ambulatory Visit (INDEPENDENT_AMBULATORY_CARE_PROVIDER_SITE_OTHER): Payer: PRIVATE HEALTH INSURANCE | Admitting: Allergy & Immunology

## 2019-01-15 ENCOUNTER — Other Ambulatory Visit: Payer: Self-pay

## 2019-01-15 VITALS — BP 150/86 | HR 102 | Temp 97.4°F | Resp 18

## 2019-01-15 DIAGNOSIS — B999 Unspecified infectious disease: Secondary | ICD-10-CM | POA: Diagnosis not present

## 2019-01-15 DIAGNOSIS — L239 Allergic contact dermatitis, unspecified cause: Secondary | ICD-10-CM

## 2019-01-15 DIAGNOSIS — L508 Other urticaria: Secondary | ICD-10-CM | POA: Diagnosis not present

## 2019-01-15 NOTE — Progress Notes (Signed)
FOLLOW UP  Date of Service/Encounter:  01/15/19   Assessment:   Chronic urticaria - with negative testing to environmental allergens today  Allergic contact dermatitis  Eosinophilia on a recent CBC  Negative food testing (via IgE panels)  Patient concern for immunodeficiency - no clear infectious history   Plan/Recommendations:   1. Chronic urticaria - We are going to get a repeat ESR to make sure that this has normalized. - Just to reassure you, we are going to get an immunoglobulin panel. - We are ordering labs, so please allow 1-2 weeks for the results to come back. - With the newly implemented Cures Act, the labs might be visible to you at the same time that they become visible to me. - However, I will not address the results until all of the results are back, so please be patient.  - In the meantime, continue avoid recommendations in your After Visit Summary, including avoidance measures (if applicable), until you hear from me.   2. Allergic contact dermatitis - We will hold off on patch testing for chemical sensitivities since you are doing so well.  3. Return if symptoms worsen or fail to improve.   Subjective:   [name] is a 44 y.o. female presenting today for follow up of  Chief Complaint  Patient presents with   Urticaria    Completely resolved besides one dry patch on her right arm.     [name] has a history of the following: Patient Active Problem List   Diagnosis Date Noted   Venous stasis dermatitis of right lower extremity 10/08/2015   Irritant contact dermatitis due to chemical 10/08/2015   Varicose veins of leg with edema 09/19/2015    History obtained from: chart review and patient.  [name] is a 43 y.o. female presenting for a follow up visit. She was last seen in September 2020 for work-up of chronic urticaria.  She did have a lot of pictures that were consistent with hives.  We obtained quite a bit of labs which were  essentially normal.  We started her on Zyrtec once daily to try to keep things under control, but I did tell her that she can increase this dosing to 2 tablets twice daily.  She also has a history of what sounds like allergic contact dermatitis.  We did talk about doing patch testing for this. She did have an ESR of 83, which was about the only abnormal thing in her history. Her CRP was normal.    Since the last visit, she has done well. She is not using any cetirizine at all. She did start Googling about inflammation. 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 She went to see the doctor when she was younger and this was completely pushed aside. She was diagnosed with chronic venous insufficiency, which is thought to be related to her leg swelling. She has had ultrasounds to look for DVTs. This workup was within normal limits.   She tells me that her PCP discussed an immunodeficiency. She has only had one course of antibiotics. She has had otherwise no problems with recurrent infections. It is unclear why she thinks that she has an immunodeficiency, but we will do some screening today, if only for reassurance.   Otherwise, there have been no changes to her past medical history, surgical history, family history, or social history.    Review of Systems  Constitutional: Negative.  Negative for fever, malaise/fatigue and weight loss.  HENT: Negative.  Negative for congestion, ear discharge and ear pain.   Eyes: Negative for pain, discharge and redness.  Respiratory: Negative for cough, sputum production, shortness of breath and wheezing.   Cardiovascular: Negative.  Negative for chest pain and palpitations.  Gastrointestinal: Negative for abdominal pain, heartburn, nausea and vomiting.  Skin: Negative.  Negative for itching and rash.  Neurological: Negative for dizziness and headaches.  Endo/Heme/Allergies: Negative for environmental  allergies. Does not bruise/bleed easily.       Objective:   Blood pressure (!) 150/86, pulse (!) 102, temperature (!) 97.4 F (36.3 C), temperature source Temporal, resp. rate 18, SpO2 100 %. There is no height or weight on file to calculate BMI.   Physical Exam:  Physical Exam  Constitutional: She appears well-developed.  Talkative female. Somewhat anxious.   HENT:  Head: Normocephalic and atraumatic.  Right Ear: Tympanic membrane, external ear and ear canal normal.  Left Ear: Tympanic membrane and ear canal normal.  Nose: No mucosal edema, rhinorrhea, nasal deformity or septal deviation. No epistaxis. Right sinus exhibits no maxillary sinus tenderness and no frontal sinus tenderness. Left sinus exhibits no maxillary sinus tenderness and no frontal sinus tenderness.  Mouth/Throat: Uvula is midline and oropharynx is clear and moist. Mucous membranes are not pale and not dry.  Eyes: Pupils are equal, round, and reactive to light. Conjunctivae and EOM are normal. Right eye exhibits no chemosis and no discharge. Left eye exhibits no chemosis and no discharge. Right conjunctiva is not injected. Left conjunctiva is not injected.  Cardiovascular: Normal rate, regular rhythm and normal heart sounds.  Respiratory: Effort normal and breath sounds normal. No accessory muscle usage. No tachypnea. No respiratory distress. She has no wheezes. She has no rhonchi. She has no rales. She exhibits no tenderness.  Lymphadenopathy:    She has no cervical adenopathy.  Neurological: She is alert.  Skin: No abrasion, no petechiae and no rash noted. Rash is not papular, not vesicular and not urticarial. No erythema. No pallor.  Psychiatric: She has a normal mood and affect.     Diagnostic studies: labs sent instead      Salvatore Marvel, MD  [facility]

## 2019-01-15 NOTE — Patient Instructions (Addendum)
1. Chronic urticaria - We are going to get a repeat ESR to make sure that this has normalized. - Just to reassure you, we are going to get an immunoglobulin panel. - We are ordering labs, so please allow 1-2 weeks for the results to come back. - With the newly implemented Cures Act, the labs might be visible to you at the same time that they become visible to me. - However, I will not address the results until all of the results are back, so please be patient.  - In the meantime, continue avoid recommendations in your After Visit Summary, including avoidance measures (if applicable), until you hear from me.   2. Allergic contact dermatitis - We will hold off on patch testing for chemical sensitivities since you are doing so well.  3. Return if symptoms worsen or fail to improve.    Please inform us of any Emergency Department visits, hospitalizations, or changes in symptoms. Call us before going to the ED for breathing or allergy symptoms since we might be able to fit you in for a sick visit. Feel free to contact us anytime with any questions, problems, or concerns.  It was a pleasure to see you again today! DO NOT FORGET TO VOTE!   Websites that have reliable patient information: 1. American Academy of Asthma, Allergy, and Immunology: www.aaaai.org 2. Food Allergy Research and Education (FARE): foodallergy.org 3. Mothers of Asthmatics: http://www.asthmacommunitynetwork.org 4. American College of Allergy, Asthma, and Immunology: www.acaai.org  "Like" us on Facebook and Instagram for our latest updates!      Make sure you are registered to vote! If you have moved or changed any of your contact information, you will need to get this updated before voting!  In some cases, you MAY be able to register to vote online: https://www.ncsbe.gov/Voters/Registering-to-Vote    Voter ID laws are NOT going into effect for the General Election in November 2020! DO NOT let this stop you from  exercising your right to vote!   Absentee voting is the SAFEST way to vote during the coronavirus pandemic!   Download and print an absentee ballot request form at rebrand.ly/GCO-Ballot-Request or you can scan the QR code below with your smart phone:      More information on absentee ballots can be found here: [email or web address]://[email or web address]-[email or web address]  [place] EARLY VOTING SITES have been established! You can register to vote and cast your vote on the same day at these locations. See this site for more information on what you need to register to vote: https://www.ncsbe.gov/registering/how-register       

## 2019-01-16 LAB — SEDIMENTATION RATE: Sed Rate: 81 mm/hr — ABNORMAL HIGH (ref 0–32)

## 2019-01-16 LAB — IGG, IGA, IGM
IgA/Immunoglobulin A, Serum: 347 mg/dL (ref 87–352)
IgG (Immunoglobin G), Serum: 1769 mg/dL — ABNORMAL HIGH (ref 586–1602)
IgM (Immunoglobulin M), Srm: 134 mg/dL (ref 26–217)

## 2019-02-09 ENCOUNTER — Ambulatory Visit: Payer: BC Managed Care – PPO

## 2019-03-02 ENCOUNTER — Ambulatory Visit: Payer: PRIVATE HEALTH INSURANCE

## 2019-03-31 ENCOUNTER — Ambulatory Visit (INDEPENDENT_AMBULATORY_CARE_PROVIDER_SITE_OTHER): Payer: 59 | Admitting: Internal Medicine

## 2019-03-31 ENCOUNTER — Ambulatory Visit: Payer: BC Managed Care – PPO | Admitting: Family Medicine

## 2019-03-31 DIAGNOSIS — R700 Elevated erythrocyte sedimentation rate: Secondary | ICD-10-CM | POA: Diagnosis not present

## 2019-03-31 DIAGNOSIS — L508 Other urticaria: Secondary | ICD-10-CM

## 2019-03-31 NOTE — Progress Notes (Signed)
Virtual Visit via Telephone Note Due to current restrictions/limitations of in-office visits due to the COVID-19 pandemic, this scheduled clinical appointment was converted to a telehealth visit  I connected with [name] on 03/31/19 at 10:07 a.m  by telephone and verified that I am speaking with the correct person using two identifiers. I am in my office.  The patient is at home.  Only the patient and myself participated in this encounter.  I discussed the limitations, risks, security and privacy concerns of performing an evaluation and management service by telephone and the availability of in person appointments. I also discussed with the patient that there may be a patient responsible charge related to this service. The patient expressed understanding and agreed to proceed.  History of Present Illness: Pt with hx of chronic urticaria.  She has seen an allergist Dr. Scherrie Merritts x 2.  Work up did not reveal a cause.  Lab test revealed elev sed rate in 80s.  Pt reports the allergist was not too concern bout the elev sed rate.  I do see that he recommended referral to rheumatologist.  She reports some gradual aching over past several wks. She works in Airline pilot as Producer, television/film/video.  Hx of varicose veins.    Observations/Objective: Results for orders placed or performed in visit on 01/15/19  Sedimentation rate  Result Value Ref Range   Sed Rate 81 (H) 0 - 32 mm/hr  IgG, IgA, IgM  Result Value Ref Range   IgG (Immunoglobin G), Serum 1,769 (H) 586 - 1,602 mg/dL   IgA/Immunoglobulin A, Serum 347 87 - 352 mg/dL   IgM (Immunoglobulin M), Srm 134 26 - 217 mg/dL     Assessment and Plan: 1. Elevated sed rate We will refer to rheumatology for further evaluation to see if the specialist can shed some light on this.  Patient had requested that I look at her other labs to see if there were any concerning results.  I told her the only other thing I saw was a mild elevation in IgG antibodies but the  allergist was not concerned about this. - Ambulatory referral to Rheumatology  2. Chronic urticaria   Follow Up Instructions: Patient has follow-up next month with new PCP for physical.   I discussed the assessment and treatment plan with the patient. The patient was provided an opportunity to ask questions and all were answered. The patient agreed with the plan and demonstrated an understanding of the instructions.   The patient was advised to call back or seek an in-person evaluation if the symptoms worsen or if the condition fails to improve as anticipated.  I provided 12 minutes of non-face-to-face time during this encounter.   Jonah Blue, MD

## 2019-04-27 ENCOUNTER — Ambulatory Visit: Payer: PRIVATE HEALTH INSURANCE | Admitting: Internal Medicine

## 2019-06-21 ENCOUNTER — Other Ambulatory Visit: Payer: Self-pay

## 2019-06-21 ENCOUNTER — Emergency Department (HOSPITAL_BASED_OUTPATIENT_CLINIC_OR_DEPARTMENT_OTHER)
Admission: EM | Admit: 2019-06-21 | Discharge: 2019-06-21 | Disposition: A | Discharge status: Home / Self Care | Payer: 59 | Attending: Emergency Medicine | Admitting: Emergency Medicine

## 2019-06-21 DIAGNOSIS — R21 Rash and other nonspecific skin eruption: Secondary | ICD-10-CM | POA: Diagnosis present

## 2019-06-21 MED ORDER — PREDNISONE 20 MG PO TABS: 40.0000 mg | ORAL_TABLET | Freq: Every day | ORAL | 0 refills | Status: DC

## 2019-06-21 MED ORDER — DOXYCYCLINE HYCLATE 100 MG PO CAPS: 100.0000 mg | ORAL_CAPSULE | Freq: Two times a day (BID) | ORAL | 0 refills | Status: DC

## 2019-06-21 NOTE — ED Triage Notes (Signed)
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

## 2019-06-21 NOTE — ED Provider Notes (Signed)
[facility] EMERGENCY DEPARTMENT Provider Note   CSN: [id number] Arrival date & time: 06/21/19  0813     History Chief Complaint  Patient presents with  . Rash    [name] is a 44 y.o. female.  44 year old female with past medical history including chronic venous insufficiency of legs, chronic rash who presents with possible cellulitis of R leg.  Patient reports longstanding history of intermittent problems with rash.  She has previously had cellulitis when the rash gets severe.  She notes that the rash has responded well in the past to steroids but when she goes off of the steroids the rash returns.  She saw allergist with no diagnosis and has been referred to a rheumatologist for possible autoimmune condition but has not followed up yet due to lack of insurance.  She notes that 2 weeks ago she began having an area of the rash on her right ankle. The area has had some weeping and erythema.  She has been treating it with homeopathic remedies including garlic.  She felt like it was initially improving but now has gotten worse.  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  She has started having the rash returned on her back and in gluteal fold. No fevers or recent illness, no medications PTA.  The history is provided by the patient.  Rash      Past Medical History:  Diagnosis Date  . Angio-edema   . Eczema   . Urticaria   . Varicose veins of both lower extremities 09/19/2015  . Venous stasis dermatitis of right lower extremity 10/08/2015    Patient Active Problem List   Diagnosis Date Noted  . Venous stasis dermatitis of right lower extremity 10/08/2015  . Irritant contact dermatitis due to chemical 10/08/2015  . Varicose veins of leg with edema 09/19/2015    No past surgical history on file.   OB History   No obstetric history on file.     Family History  Problem Relation Age of Onset  .  Hypertension Mother   . Edema Sister        leg   . Allergic rhinitis Neg Hx   . Angioedema Neg Hx   . Asthma Neg Hx   . Atopy Neg Hx   . Eczema Neg Hx   . Immunodeficiency Neg Hx     Social History   Tobacco Use  . Smoking status: Never Smoker  . Smokeless tobacco: Never Used  Substance Use Topics  . Alcohol use: No  . Drug use: No    Home Medications Prior to Admission medications   Medication Sig Start Date End Date Taking? Authorizing Provider  doxycycline (VIBRAMYCIN) 100 MG capsule Take 1 capsule (100 mg total) by mouth 2 (two) times daily. 06/21/19   Curtiss Mahmood, Ambrose [place], MD  predniSONE (DELTASONE) 20 MG tablet Take 2 tablets (40 mg total) by mouth daily. Take 40 mg by mouth daily for 3 days, then 20mg  by mouth daily for 3 days, then 10mg  daily for 3 days 06/21/19   Kimyatta Lecy, , MD  ranitidine (ZANTAC) 150 MG tablet Take 1 tablet (150 mg total) by mouth 2 (two) times daily. Take until the rash/swelling resolves Patient not taking: Reported on 04/08/2017 09/18/15 10/13/18  Street, [place], 10/15/18    Allergies    Patient has no known allergies.  Review of Systems   Review of Systems  Skin:  Positive for rash.   All other systems reviewed and are negative except that which was mentioned in HPI  Physical Exam Updated Vital Signs BP (!) 142/76   Pulse 88   Temp 98.6 F (37 C) (Oral)   Resp 16   Ht 5\' 4"  (1.626 m)   Wt 64 kg   SpO2 100%   BMI 24.20 kg/m   Physical Exam Vitals and nursing note reviewed.  Constitutional:      General: She is not in acute distress.    Appearance: She is well-developed.  HENT:     Head: Normocephalic and atraumatic.  Eyes:     Conjunctiva/sclera: Conjunctivae normal.  Musculoskeletal:     Cervical back: Neck supple.     Right lower leg: Edema present.     Left lower leg: Edema present.  Skin:    General: Skin is warm and dry.     Findings: Rash present.     Comments: Area of chronic venous stasis dermatitis on  medial R ankle w/ some areas of cracked skin and faint redness, no streaking up leg and no active drainage; scattered skin eruption w/ macules and scaling skin on back  Neurological:     Mental Status: She is alert and oriented to person, place, and time.  Psychiatric:        Judgment: Judgment normal.     ED Results / Procedures / Treatments   Labs (all labs ordered are listed, but only abnormal results are displayed) Labs Reviewed - No data to display  EKG None  Radiology No results found.  Procedures Procedures (including critical care time)  Medications Ordered in ED Medications - No data to display  ED Course  I have reviewed the triage vital signs and the nursing notes.     MDM Rules/Calculators/A&P                      Appearance of ankle rash suggests chronic venous stasis dermatitis with possible early cellulitis due to skin cracking.  The area on her back does not appear acutely infected, resembles photos of previous rashes in her chart.  Ultimately, I emphasized the importance of seeing a rheumatologist or a dermatologist as I suspect underlying autoimmune condition based on chronicity but will place on prednisone taper for now to help with other areas of rash.  Will treat ankle area for cellulitis.  I discussed wound care and importance of elevation of legs whenever possible.  She voiced understanding. Final Clinical Impression(s) / ED Diagnoses Final diagnoses:  Rash and nonspecific skin eruption    Rx / DC Orders ED Discharge Orders         Ordered    doxycycline (VIBRAMYCIN) 100 MG capsule  2 times daily     06/21/19 0859    predniSONE (DELTASONE) 20 MG tablet  Daily     06/21/19 0859           Edwardine Deschepper, Wenda Overland, MD 06/21/19 [phone]

## 2019-07-09 ENCOUNTER — Telehealth: Payer: Self-pay | Admitting: Internal Medicine

## 2019-07-09 NOTE — Telephone Encounter (Signed)
You can go into the referral & change the status from "closed". I don't have access so I'm unable to edit the status. Arna Medici does it all the time if you have questions about how to change the status.

## 2019-07-09 NOTE — Telephone Encounter (Signed)
Patient needs new referral for Rheumatology. Her last referral has been closed.

## 2019-09-20 ENCOUNTER — Emergency Department (HOSPITAL_BASED_OUTPATIENT_CLINIC_OR_DEPARTMENT_OTHER)
Admission: EM | Admit: 2019-09-20 | Discharge: 2019-09-20 | Disposition: A | Discharge status: Home / Self Care | Payer: 59 | Attending: Emergency Medicine | Admitting: Emergency Medicine

## 2019-09-20 ENCOUNTER — Other Ambulatory Visit: Payer: Self-pay

## 2019-09-20 ENCOUNTER — Encounter (HOSPITAL_BASED_OUTPATIENT_CLINIC_OR_DEPARTMENT_OTHER): Payer: Self-pay | Admitting: Emergency Medicine

## 2019-09-20 DIAGNOSIS — L259 Unspecified contact dermatitis, unspecified cause: Secondary | ICD-10-CM | POA: Diagnosis not present

## 2019-09-20 DIAGNOSIS — L309 Dermatitis, unspecified: Secondary | ICD-10-CM

## 2019-09-20 DIAGNOSIS — R21 Rash and other nonspecific skin eruption: Secondary | ICD-10-CM | POA: Diagnosis present

## 2019-09-20 MED ORDER — DOXYCYCLINE HYCLATE 100 MG PO CAPS: 100.0000 mg | ORAL_CAPSULE | Freq: Two times a day (BID) | ORAL | 0 refills | Status: DC

## 2019-09-20 MED ORDER — PREDNISONE 20 MG PO TABS: ORAL_TABLET | ORAL | 0 refills | Status: DC

## 2019-09-20 MED ORDER — PREDNISONE 20 MG PO TABS: 40.0000 mg | ORAL_TABLET | Freq: Every day | ORAL | 0 refills | Status: DC

## 2019-09-20 MED ORDER — DOXYCYCLINE HYCLATE 100 MG PO CAPS: 100.0000 mg | ORAL_CAPSULE | Freq: Two times a day (BID) | ORAL | 0 refills | Status: AC

## 2019-09-20 NOTE — ED Provider Notes (Signed)
[facility] EMERGENCY DEPARTMENT Provider Note   CSN: [id number] Arrival date & time: 09/20/19  6767     History Chief Complaint  Patient presents with  . Recurrent Skin Infections    [name] is a 44 y.o. female.  The history is provided by the patient.  Rash Location:  Face (back, right lower leg) Severity:  Mild Onset quality:  Gradual Timing:  Constant Chronicity:  Recurrent Context comment:  Patient states she gets recurrent skin rash, no real workup in past, supposed to maybe see a rheumatologist. States every once in a while gets rash similir to today and clears with antiotics and steroids. Relieved by:  Nothing Worsened by:  Nothing Associated symptoms: no fatigue, no fever, no joint pain, no myalgias and no shortness of breath        Past Medical History:  Diagnosis Date  . Angio-edema   . Eczema   . Urticaria   . Varicose veins of both lower extremities 09/19/2015  . Venous stasis dermatitis of right lower extremity 10/08/2015    Patient Active Problem List   Diagnosis Date Noted  . Venous stasis dermatitis of right lower extremity 10/08/2015  . Irritant contact dermatitis due to chemical 10/08/2015  . Varicose veins of leg with edema 09/19/2015    History reviewed. No pertinent surgical history.   OB History   No obstetric history on file.     Family History  Problem Relation Age of Onset  . Hypertension Mother   . Edema Sister        leg   . Allergic rhinitis Neg Hx   . Angioedema Neg Hx   . Asthma Neg Hx   . Atopy Neg Hx   . Eczema Neg Hx   . Immunodeficiency Neg Hx     Social History   Tobacco Use  . Smoking status: Never Smoker  . Smokeless tobacco: Never Used  Vaping Use  . Vaping Use: Never used  Substance Use Topics  . Alcohol use: No  . Drug use: No    Home Medications Prior to Admission medications   Medication Sig Start Date End Date Taking? Authorizing Provider  doxycycline (VIBRAMYCIN) 100 MG  capsule Take 1 capsule (100 mg total) by mouth 2 (two) times daily for 7 days. 09/20/19 09/27/19  Avier Jech, DO  predniSONE (DELTASONE) 20 MG tablet Take 2 tablets (40 mg total) by mouth daily. Take 40 mg by mouth daily for 3 days, then 20mg  by mouth daily for 3 days, then 10mg  daily for 3 days 09/20/19   , DO  ranitidine (ZANTAC) 150 MG tablet Take 1 tablet (150 mg total) by mouth 2 (two) times daily. Take until the rash/swelling resolves Patient not taking: Reported on 04/08/2017 09/18/15 10/13/18  Street, [place], 10/15/18    Allergies    Patient has no known allergies.  Review of Systems   Review of Systems  Constitutional: Negative for chills, fatigue and fever.  Respiratory: Negative for shortness of breath.   Cardiovascular: Negative for chest pain.  Musculoskeletal: Negative for arthralgias, gait problem, joint swelling and myalgias.  Skin: Positive for rash. Negative for color change, pallor and wound.    Physical Exam Updated Vital Signs BP (!) 169/86 (BP Location: Left Arm)   Pulse 95   Temp 98.9 F (37.2 C) (Oral)   Resp 18   Ht 5\' 4"  (1.626 m)   Wt 64 kg   LMP 09/07/2019   SpO2 100%   BMI 24.20  kg/m   Physical Exam Constitutional:      General: She is not in acute distress.    Appearance: She is not ill-appearing.  HENT:     Head: Normocephalic.  Cardiovascular:     Pulses: Normal pulses.  Musculoskeletal:        General: No swelling or tenderness. Normal range of motion.  Skin:    General: Skin is warm.     Capillary Refill: Capillary refill takes less than 2 seconds.     Findings: Rash present.     Comments: Patient with maculopapular type rash to face, low back, on the right ankle some macular papular type rash and skin breakdown to both the medial lateral ankle  Neurological:     Mental Status: She is alert.     Sensory: No sensory deficit.     Motor: No weakness.     ED Results / Procedures / Treatments   Labs (all labs ordered are  listed, but only abnormal results are displayed) Labs Reviewed - No data to display  EKG None  Radiology No results found.  Procedures Procedures (including critical care time)  Medications Ordered in ED Medications - No data to display  ED Course  I have reviewed the triage vital signs and the nursing notes.  Pertinent labs & imaging results that were available during my care of the patient were reviewed by me and considered in my medical decision making (see chart for details).    MDM Rules/Calculators/A&P                          [name] is a 44 year old female who presents to the ED with rash.  Patient with unremarkable vitals.  No fever.  History of dermatitis/venous stasis.  She states that time she gets rash and inflammation at varicose veins in the right lower leg.  Has been there for several days.  Has it also on her face and low back which she says happens at times.  Usually clears up pretty well with steroids and antibiotics.  Has been going on for several years.  May have a rheumatology appointment coming up to further investigate.  She has some dermatitis/macular papular type rash to the right lower leg around the medial lateral ankle at very focal spots.  No concern for septic joint or gout.  She has some macular papular rash meet the eyes and on the low back.  Overall will treat with steroids antibiotics as possibly mild cellulitis around these areas.  Recommend follow-up with primary care doctor and discharged in the ED in good condition.  This chart was dictated using voice recognition software.  Despite best efforts to proofread,  errors can occur which can change the documentation meaning.  Final Clinical Impression(s) / ED Diagnoses Final diagnoses:  Dermatitis    Rx / DC Orders ED Discharge Orders         Ordered    doxycycline (VIBRAMYCIN) 100 MG capsule  2 times daily     Discontinue  Reprint     09/20/19 0935    predniSONE (DELTASONE) 20 MG tablet   Daily     Discontinue  Reprint     09/20/19 0935           Lennice Sites, DO 09/20/19 [phone]

## 2019-09-20 NOTE — ED Triage Notes (Signed)
States she has cellulitis to R ankle, hx of same. Also reports "breaking out" all over.

## 2019-10-06 ENCOUNTER — Other Ambulatory Visit: Payer: Self-pay

## 2019-10-06 ENCOUNTER — Ambulatory Visit
Admission: EM | Admit: 2019-10-06 | Discharge: 2019-10-06 | Disposition: A | Discharge status: Home / Self Care | Payer: 59 | Attending: Emergency Medicine | Admitting: Emergency Medicine

## 2019-10-06 ENCOUNTER — Encounter: Payer: Self-pay | Admitting: Emergency Medicine

## 2019-10-06 DIAGNOSIS — I872 Venous insufficiency (chronic) (peripheral): Secondary | ICD-10-CM | POA: Diagnosis not present

## 2019-10-06 DIAGNOSIS — R600 Localized edema: Secondary | ICD-10-CM | POA: Diagnosis not present

## 2019-10-06 DIAGNOSIS — L309 Dermatitis, unspecified: Secondary | ICD-10-CM

## 2019-10-06 MED ORDER — TRIAMCINOLONE ACETONIDE 0.1 % EX CREA: 1.0000 "application " | TOPICAL_CREAM | Freq: Two times a day (BID) | CUTANEOUS | 0 refills | Status: DC

## 2019-10-06 MED ORDER — METHYLPREDNISOLONE SODIUM SUCC 125 MG IJ SOLR
125.0000 mg | Freq: Once | INTRAMUSCULAR | Status: AC
  Administered 2019-10-06: 125 mg via INTRAMUSCULAR

## 2019-10-06 MED ORDER — CLOTRIMAZOLE 1 % EX CREA: TOPICAL_CREAM | CUTANEOUS | 0 refills | Status: DC

## 2019-10-06 MED ORDER — MUPIROCIN 2 % EX OINT: 1.0000 "application " | TOPICAL_OINTMENT | Freq: Two times a day (BID) | CUTANEOUS | 0 refills | Status: DC

## 2019-10-06 NOTE — ED Triage Notes (Addendum)
Patient presents to [facility] for assessment of right foot swelling, cellulitis, and rash x 3 weeks.  States she has a hx of foot swelling that leads to cellullitis, which leads to a break out in other areas.  States she was recently treated with anitbiotics and steroids and finished them approx 1 week ago.  Patient states it never totally went away, but got better, but has worsened in the last three days.  Patient states the right foot is painful and leaking fluid.  Pt states she had doxycycline on her allergy list "for a long time" but had it removed.  States she was given that in the ER for this flare up.

## 2019-10-06 NOTE — Discharge Instructions (Signed)
Keep skin clean - may use gentle soaps without perfumes/dyes. Avoid hot water (showers, baths) as this can further dry out and irritate skin. Pat skin dry as rubbing can irritate and tear skin. Apply a gentle moisturizer 1-2 times daily. 

## 2019-10-06 NOTE — ED Provider Notes (Signed)
[facility]    CSN: [id number] Arrival date & time: 10/06/19  0998      History   Chief Complaint Chief Complaint  Patient presents with  . Rash    HPI [name] is a 44 y.o. female with history of eczema, varicose veins, venous stasis dermatitis presenting for rash to right lower extremity, back.  States rash is itchy.  Denies discharge, fever, arthralgias, myalgias.  States it is "not as bad "as when she went to ER for in June 2021.  Please see those notes for additional H&P: Treated for secondary cellulitis to venous stasis dermatitis with doxycycline, prednisone.  Patient tolerated this well.  Denies recent change in medications, has not followed up with rheumatology yet.  Past Medical History:  Diagnosis Date  . Angio-edema   . Eczema   . Urticaria   . Varicose veins of both lower extremities 09/19/2015  . Venous stasis dermatitis of right lower extremity 10/08/2015    Patient Active Problem List   Diagnosis Date Noted  . Venous stasis dermatitis of right lower extremity 10/08/2015  . Irritant contact dermatitis due to chemical 10/08/2015  . Varicose veins of leg with edema 09/19/2015    History reviewed. No pertinent surgical history.  OB History   No obstetric history on file.      Home Medications    Prior to Admission medications   Medication Sig Start Date End Date Taking? Authorizing Provider  clotrimazole (LOTRIMIN) 1 % cream Apply to affected area 2 times daily 10/06/19   Hall-Potvin, [place], PA-C  mupirocin ointment (BACTROBAN) 2 % Apply 1 application topically 2 (two) times daily. 10/06/19   Hall-Potvin, [place], PA-C  triamcinolone cream (KENALOG) 0.1 % Apply 1 application topically 2 (two) times daily. 10/06/19   Hall-Potvin, [place], PA-C  ranitidine (ZANTAC) 150 MG tablet Take 1 tablet (150 mg total) by mouth 2 (two) times daily. Take until the rash/swelling resolves Patient not taking: Reported on 04/08/2017 09/18/15 10/13/18   Street, [place], PA-C    Family History Family History  Problem Relation Age of Onset  . Hypertension Mother   . Edema Sister        leg   . Allergic rhinitis Neg Hx   . Angioedema Neg Hx   . Asthma Neg Hx   . Atopy Neg Hx   . Eczema Neg Hx   . Immunodeficiency Neg Hx     Social History Social History   Tobacco Use  . Smoking status: Never Smoker  . Smokeless tobacco: Never Used  Vaping Use  . Vaping Use: Never used  Substance Use Topics  . Alcohol use: No  . Drug use: No     Allergies   Patient has no known allergies.   Review of Systems As per HPI   Physical Exam Triage Vital Signs ED Triage Vitals  Enc Vitals Group     BP 10/06/19 1011 (!) 155/91     Pulse Rate 10/06/19 1011 89     Resp 10/06/19 1011 18     Temp 10/06/19 1011 98.7 F (37.1 C)     Temp Source 10/06/19 1011 Oral     SpO2 10/06/19 1011 99 %     Weight --      Height --      Head Circumference --      Peak Flow --      Pain Score 10/06/19 1012 5     Pain Loc --      Pain  Edu? --      Excl. in GC? --    No data found.  Updated Vital Signs BP (!) 155/91 (BP Location: Left Arm)   Pulse 89   Temp 98.7 F (37.1 C) (Oral)   Resp 18   LMP 09/07/2019   SpO2 99%   Visual Acuity Right Eye Distance:   Left Eye Distance:   Bilateral Distance:    Right Eye Near:   Left Eye Near:    Bilateral Near:     Physical Exam Constitutional:      General: She is not in acute distress. HENT:     Head: Normocephalic and atraumatic.  Eyes:     General: No scleral icterus.    Pupils: Pupils are equal, round, and reactive to light.  Cardiovascular:     Rate and Rhythm: Normal rate.  Pulmonary:     Effort: Pulmonary effort is normal.  Musculoskeletal:     Comments: Nonpitting edema of right distal extremity to mid shin.  Patient does have venous stasis dermatitis with minor cracking in the skin.  Slight erythema without warmth or tenderness.  Neurovascularly intact.  Patient also has  significant, dry dermatitis over the low back without surrounding erythema, discharge, tenderness, streaking.  Skin:    Coloration: Skin is not jaundiced or pale.  Neurological:     Mental Status: She is alert and oriented to person, place, and time.      UC Treatments / Results  Labs (all labs ordered are listed, but only abnormal results are displayed) Labs Reviewed - No data to display  EKG   Radiology No results found.  Procedures Procedures (including critical care time)  Medications Ordered in UC Medications  methylPREDNISolone sodium succinate (SOLU-MEDROL) 125 mg/2 mL injection 125 mg (125 mg Intramuscular Given 10/06/19 1034)    Initial Impression / Assessment and Plan / UC Course  I have reviewed the triage vital signs and the nursing notes.  Pertinent labs & imaging results that were available during my care of the patient were reviewed by me and considered in my medical decision making (see chart for details).     Patient febrile, nontoxic in office today.  Low concern for secondary cellulitis, though patient is high risk for this: We will defer oral antibiotics and try topical mupirocin.  Will do combination therapy for possible Candida dermatitis over low back.  Ace wrap applied to right lower extremity to help with chronic venous stasis.  Discussed importance of follow-up with PCP/rheumatology for further evaluation/management of likely autoimmune process that is contributory to patient's symptoms.  Return precautions discussed, patient verbalized understanding and is agreeable to plan. Final Clinical Impressions(s) / UC Diagnoses   Final diagnoses:  Venous stasis dermatitis of right lower extremity  Dermatitis     Discharge Instructions     Keep skin clean - may use gentle soaps without perfumes/dyes. Avoid hot water (showers, baths) as this can further dry out and irritate skin. Pat skin dry as rubbing can irritate and tear skin. Apply a gentle  moisturizer 1-2 times daily.    ED Prescriptions    Medication Sig Dispense Auth. Provider   triamcinolone cream (KENALOG) 0.1 % Apply 1 application topically 2 (two) times daily. 30 g Hall-Potvin, [place], PA-C   clotrimazole (LOTRIMIN) 1 % cream Apply to affected area 2 times daily 15 g Hall-Potvin, [place], PA-C   mupirocin ointment (BACTROBAN) 2 % Apply 1 application topically 2 (two) times daily. 30 g Hall-Potvin, [place], PA-C  PDMP not reviewed this encounter.   Hall-Potvin, [place], [place] 10/06/19 1054

## 2019-10-16 ENCOUNTER — Telehealth: Payer: Self-pay

## 2019-10-16 NOTE — Telephone Encounter (Signed)

## 2019-10-16 NOTE — Patient Instructions (Incomplete)
Thank you for choosing [facility] at [facility] to be your medical home!    HOUA NIE was seen by De Hollingshead, DO today.   Janne Napoleon Totty's primary care provider is [name], DO.   For the best care possible, you should try to see [name], DO whenever you come to the clinic.   We look forward to seeing you again soon!  If you have any questions about your visit today, please call us at [phone] or feel free to reach your primary care provider via MyChart.

## 2019-10-19 ENCOUNTER — Telehealth: Payer: 59 | Admitting: Internal Medicine

## 2019-10-19 DIAGNOSIS — I872 Venous insufficiency (chronic) (peripheral): Secondary | ICD-10-CM

## 2019-10-19 DIAGNOSIS — R609 Edema, unspecified: Secondary | ICD-10-CM

## 2019-10-19 NOTE — Patient Instructions (Signed)
Thank you for choosing [facility] at [facility] to be your medical home!    [name] was seen by Catherine L Wallace, DO today.   [name]'s primary care provider is Catherine Wallace, DO.   For the best care possible, you should try to see Catherine Wallace, DO whenever you come to the clinic.   We look forward to seeing you again soon!  If you have any questions about your visit today, please call us at [phone] or feel free to reach your primary care provider via MyChart.    

## 2019-10-20 ENCOUNTER — Encounter: Payer: Self-pay | Admitting: Internal Medicine

## 2019-10-20 ENCOUNTER — Telehealth (INDEPENDENT_AMBULATORY_CARE_PROVIDER_SITE_OTHER): Payer: 59 | Admitting: Internal Medicine

## 2019-10-20 DIAGNOSIS — I872 Venous insufficiency (chronic) (peripheral): Secondary | ICD-10-CM | POA: Diagnosis not present

## 2019-10-20 DIAGNOSIS — R700 Elevated erythrocyte sedimentation rate: Secondary | ICD-10-CM | POA: Diagnosis not present

## 2019-10-20 MED ORDER — MUPIROCIN 2 % EX OINT: 1.0000 "application " | TOPICAL_OINTMENT | Freq: Two times a day (BID) | CUTANEOUS | 0 refills | Status: DC

## 2019-10-20 MED ORDER — TRIAMCINOLONE ACETONIDE 0.1 % EX CREA: 1.0000 "application " | TOPICAL_CREAM | Freq: Two times a day (BID) | CUTANEOUS | 0 refills | Status: DC

## 2019-10-20 MED ORDER — CLOTRIMAZOLE 1 % EX CREA: TOPICAL_CREAM | CUTANEOUS | 0 refills | Status: DC

## 2019-10-20 NOTE — Progress Notes (Signed)
Virtual Visit via Telephone Note  I connected with [name], on 10/20/2019 at 8:45 AM by telephone due to the COVID-19 pandemic and verified that I am speaking with the correct person using two identifiers.   Consent: I discussed the limitations, risks, security and privacy concerns of performing an evaluation and management service by telephone and the availability of in person appointments. I also discussed with the patient that there may be a patient responsible charge related to this service. The patient expressed understanding and agreed to proceed.   Location of Patient: Home   Location of Provider: Clinic    Persons participating in Telemedicine visit: Lorieann Argueta [facility] Dr. Earlene Plater    History of Present Illness: Patient has a visit to follow up on rash, venous stasis dermatitis. At ER visit 6/27, was treated with oral steroids and antibiotics. She went to urgent care on 7/13 and was given topical steroid cream, topical antifungal, and topical bacterial cream. 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 Spends most of her day at work healing. She uses compression immediately after getting out of shower.    Was never seen by rheum, she was unable to answer the phone from prior referral placed by prior PCP. Asks for another referral.    Past Medical History:  Diagnosis Date  . Angio-edema   . Eczema   . Urticaria   . Varicose veins of both lower extremities 09/19/2015  . Venous stasis dermatitis of right lower extremity 10/08/2015   No Known Allergies  Current Outpatient Medications on File Prior to Visit  Medication Sig Dispense Refill  . clotrimazole (LOTRIMIN) 1 % cream Apply to affected area 2 times daily 15 g 0  . mupirocin ointment (BACTROBAN) 2 % Apply 1 application topically 2 (two) times daily. 30 g 0  . triamcinolone cream (KENALOG) 0.1 % Apply 1 application topically 2 (two) times daily. 30 g 0   . [DISCONTINUED] ranitidine (ZANTAC) 150 MG tablet Take 1 tablet (150 mg total) by mouth 2 (two) times daily. Take until the rash/swelling resolves (Patient not taking: Reported on 04/08/2017) 30 tablet 0   No current facility-administered medications on file prior to visit.    Observations/Objective: NAD. Speaking clearly.  Work of breathing normal.  Venous stasis dermatitis present over the lower extremities bilaterally with very minor cracking of sin.  Alert and oriented. Mood appropriate.   Assessment and Plan: 1. Venous stasis dermatitis of right lower extremity Appears to be healing well without concern for significant secondary cellulitis. Advised continued use of topical therapies and compression stockings. Elevate feet when able. Unfortunately with her current job position, it will be difficult for her to heal with the demands of standing for long hours. We discussed that she is self employed and that it is up to her discretion if she would like to take time off work. I have recommended that this be considered during acute flare of the dermatitis.  - clotrimazole (LOTRIMIN) 1 % cream; Apply to affected area 2 times daily  Dispense: 15 g; Refill: 0 - mupirocin ointment (BACTROBAN) 2 %; Apply 1 application topically 2 (two) times daily.  Dispense: 30 g; Refill: 0 - triamcinolone cream (KENALOG) 0.1 %; Apply 1 application topically 2 (two) times daily.  Dispense: 30 g; Refill: 0  2. Elevated sed rate Elevated ESRs noted from previous PCP. Prior PCP had placed rheumatology referral and unfortunately patient missed phone call to schedule  and referral was closed.  - Ambulatory referral to Rheumatology   Follow Up Instructions: PRN and for routine medical care    I discussed the assessment and treatment plan with the patient. The patient was provided an opportunity to ask questions and all were answered. The patient agreed with the plan and demonstrated an understanding of the  instructions.   The patient was advised to call back or seek an in-person evaluation if the symptoms worsen or if the condition fails to improve as anticipated.     I provided 22 minutes total of non-face-to-face time during this encounter including median intraservice time, reviewing previous notes, investigations, ordering medications, medical decision making, coordinating care and patient verbalized understanding at the end of the visit.    [name], D.O. Primary Care at [facility]  10/20/2019, 8:45 AM

## 2019-11-11 NOTE — Patient Instructions (Signed)

## 2019-11-12 ENCOUNTER — Encounter: Payer: Self-pay | Admitting: Internal Medicine

## 2019-11-12 ENCOUNTER — Ambulatory Visit (INDEPENDENT_AMBULATORY_CARE_PROVIDER_SITE_OTHER): Payer: 59 | Admitting: Internal Medicine

## 2019-11-12 ENCOUNTER — Other Ambulatory Visit (HOSPITAL_COMMUNITY)
Admission: RE | Admit: 2019-11-12 | Discharge: 2019-11-12 | Disposition: A | Discharge status: Home / Self Care | Payer: 59 | Source: Ambulatory Visit | Attending: Internal Medicine | Admitting: Internal Medicine

## 2019-11-12 ENCOUNTER — Other Ambulatory Visit: Payer: Self-pay

## 2019-11-12 VITALS — BP 126/81 | HR 73 | Temp 97.3°F | Resp 17 | Ht 64.5 in | Wt 132.0 lb

## 2019-11-12 DIAGNOSIS — Z1159 Encounter for screening for other viral diseases: Secondary | ICD-10-CM

## 2019-11-12 DIAGNOSIS — Z0000 Encounter for general adult medical examination without abnormal findings: Secondary | ICD-10-CM

## 2019-11-12 DIAGNOSIS — Z113 Encounter for screening for infections with a predominantly sexual mode of transmission: Secondary | ICD-10-CM | POA: Insufficient documentation

## 2019-11-12 DIAGNOSIS — N6313 Unspecified lump in the right breast, lower outer quadrant: Secondary | ICD-10-CM

## 2019-11-12 DIAGNOSIS — Z1231 Encounter for screening mammogram for malignant neoplasm of breast: Secondary | ICD-10-CM

## 2019-11-12 NOTE — Progress Notes (Signed)
Subjective:    [name] - 44 y.o. female MRN [id number]  Date of birth: 09/11/1975  HPI  [name] is here for annual exam. Concerns about a breast lump on the right side--noticed it about 2-3 weeks ago during self breast exam and has been present since. Non painful. No skin color changes or drainage. No nipple drainage.   Would like STD testing. Had intercourse with an ex partner and then noticed some vaginal odor and itchiness afterwards. Has since resolved.      Health Maintenance:  Health Maintenance Due  Topic Date Due  . Hepatitis C Screening  Never done    -  reports that she has never smoked. She has never used smokeless tobacco. - Review of Systems: Per HPI. - Past Medical History: Patient Active Problem List   Diagnosis Date Noted  . Venous stasis dermatitis of right lower extremity 10/08/2015  . Irritant contact dermatitis due to chemical 10/08/2015  . Varicose veins of leg with edema 09/19/2015   - Medications: reviewed and updated   Objective:   Physical Exam BP 126/81   Pulse 73   Temp (!) 97.3 F (36.3 C) (Temporal)   Resp 17   Ht 5' 4.5" (1.638 m)   Wt 132 lb (59.9 kg)   SpO2 98%   BMI 22.31 kg/m  Physical Exam Constitutional:      Appearance: She is not diaphoretic.  HENT:     Head: Normocephalic and atraumatic.  Eyes:     Conjunctiva/sclera: Conjunctivae normal.     Pupils: Pupils are equal, round, and reactive to light.  Neck:     Thyroid: No thyromegaly.  Cardiovascular:     Rate and Rhythm: Normal rate and regular rhythm.     Heart sounds: Normal heart sounds. No murmur heard.   Pulmonary:     Effort: Pulmonary effort is normal. No respiratory distress.     Breath sounds: Normal breath sounds. No wheezing.  Chest:     Comments: Breast exam: Breasts symmetric. No erythema/rashes/skin color changes/skin texture changes/nipple discharge. No discrete palpable masses appreciated in axilla or 4 quadrants of the breast on the left side.  On the right side, there is a palpable firm and fairly fixed small lump about 1-2 cm present around the 8-9 o'clock position.   Abdominal:     General: Bowel sounds are normal. There is no distension.     Palpations: Abdomen is soft.     Tenderness: There is no abdominal tenderness. There is no guarding or rebound.  Musculoskeletal:        General: No deformity. Normal range of motion.     Cervical back: Normal range of motion and neck supple.  Lymphadenopathy:     Cervical: No cervical adenopathy.  Skin:    General: Skin is warm and dry.     Findings: No rash.     Comments: Chronic venous stasis dermatitis changes on R lower extremity.   Neurological:     Mental Status: She is alert and oriented to person, place, and time.     Gait: Gait is intact.  Psychiatric:        Mood and Affect: Mood and affect normal.        Judgment: Judgment normal.            Assessment & Plan:   1. Annual physical exam UTD on cervical cancer screening.  Declines COVID and influenza vaccination.  Counseled on 150 minutes of exercise per week,  healthy eating (including decreased daily intake of saturated fats, cholesterol, added sugars, sodium), STI prevention, routine healthcare maintenance. - Comprehensive metabolic panel - CBC - Lipid panel  2. Screening for STDs (sexually transmitted diseases) - HIV Antibody (routine testing w rflx) - RPR - Cervicovaginal ancillary only  3. Need for hepatitis C screening test - Hepatitis C antibody  4. Breast cancer screening by mammogram 5. Breast lump on right side at 8 o'clock position - MM Digital Diagnostic Bilat; Future   [name], D.O. 11/12/2019, 10:25 AM Primary Care at [facility]

## 2019-11-13 LAB — COMPREHENSIVE METABOLIC PANEL
ALT: 13 IU/L (ref 0–32)
AST: 24 IU/L (ref 0–40)
Albumin/Globulin Ratio: 1.4 (ref 1.2–2.2)
Albumin: 4.6 g/dL (ref 3.8–4.8)
Alkaline Phosphatase: 84 IU/L (ref 48–121)
BUN/Creatinine Ratio: 11 (ref 9–23)
BUN: 8 mg/dL (ref 6–24)
Bilirubin Total: 0.2 mg/dL (ref 0.0–1.2)
CO2: 23 mmol/L (ref 20–29)
Calcium: 9.2 mg/dL (ref 8.7–10.2)
Chloride: 101 mmol/L (ref 96–106)
Creatinine, Ser: 0.73 mg/dL (ref 0.57–1.00)
GFR calc Af Amer: 114 mL/min/{1.73_m2} (ref >59)
GFR calc non Af Amer: 99 mL/min/{1.73_m2} (ref >59)
Globulin, Total: 3.3 g/dL (ref 1.5–4.5)
Glucose: 89 mg/dL (ref 65–99)
Potassium: 4.5 mmol/L (ref 3.5–5.2)
Sodium: 137 mmol/L (ref 134–144)
Total Protein: 7.9 g/dL (ref 6.0–8.5)

## 2019-11-13 LAB — CBC
Hematocrit: 35 % (ref 34.0–46.6)
Hemoglobin: 10.4 g/dL — ABNORMAL LOW (ref 11.1–15.9)
MCH: 24.6 pg — ABNORMAL LOW (ref 26.6–33.0)
MCHC: 29.7 g/dL — ABNORMAL LOW (ref 31.5–35.7)
MCV: 83 fL (ref 79–97)
Platelets: 304 10*3/uL (ref 150–450)
RBC: 4.23 x10E6/uL (ref 3.77–5.28)
RDW: 16.2 % — ABNORMAL HIGH (ref 11.7–15.4)
WBC: 3.8 10*3/uL (ref 3.4–10.8)

## 2019-11-13 LAB — CERVICOVAGINAL ANCILLARY ONLY
Bacterial Vaginitis (gardnerella): NEGATIVE
Candida Glabrata: NEGATIVE
Candida Vaginitis: POSITIVE — AB
Chlamydia: NEGATIVE
Comment: NEGATIVE
Comment: NEGATIVE
Comment: NEGATIVE
Comment: NEGATIVE
Comment: NEGATIVE
Comment: NORMAL
Neisseria Gonorrhea: NEGATIVE
Trichomonas: NEGATIVE

## 2019-11-13 LAB — HIV ANTIBODY (ROUTINE TESTING W REFLEX): HIV Screen 4th Generation wRfx: NONREACTIVE

## 2019-11-13 LAB — LIPID PANEL
Chol/HDL Ratio: 2.4 ratio (ref 0.0–4.4)
Cholesterol, Total: 222 mg/dL — ABNORMAL HIGH (ref 100–199)
HDL: 92 mg/dL (ref >39)
LDL Chol Calc (NIH): 120 mg/dL — ABNORMAL HIGH (ref 0–99)
Triglycerides: 57 mg/dL (ref 0–149)
VLDL Cholesterol Cal: 10 mg/dL (ref 5–40)

## 2019-11-13 LAB — RPR: RPR Ser Ql: NONREACTIVE

## 2019-11-13 LAB — HEPATITIS C ANTIBODY: Hep C Virus Ab: <0.1 s/co ratio (ref 0.0–0.9)

## 2019-11-16 ENCOUNTER — Other Ambulatory Visit: Payer: Self-pay | Admitting: Internal Medicine

## 2019-11-16 DIAGNOSIS — N6313 Unspecified lump in the right breast, lower outer quadrant: Secondary | ICD-10-CM

## 2019-11-16 DIAGNOSIS — Z1231 Encounter for screening mammogram for malignant neoplasm of breast: Secondary | ICD-10-CM

## 2019-11-16 MED ORDER — FLUCONAZOLE 150 MG PO TABS: ORAL_TABLET | ORAL | 0 refills | Status: DC

## 2019-12-08 ENCOUNTER — Ambulatory Visit
Admission: RE | Admit: 2019-12-08 | Discharge: 2019-12-08 | Disposition: A | Discharge status: Home / Self Care | Payer: 59 | Source: Ambulatory Visit | Attending: Internal Medicine | Admitting: Internal Medicine

## 2019-12-08 ENCOUNTER — Other Ambulatory Visit: Payer: Self-pay

## 2019-12-08 DIAGNOSIS — N6313 Unspecified lump in the right breast, lower outer quadrant: Secondary | ICD-10-CM

## 2019-12-08 DIAGNOSIS — Z1231 Encounter for screening mammogram for malignant neoplasm of breast: Secondary | ICD-10-CM

## 2019-12-29 ENCOUNTER — Ambulatory Visit: Payer: 59 | Admitting: Internal Medicine

## 2020-01-01 ENCOUNTER — Telehealth: Payer: Self-pay

## 2020-01-01 NOTE — Telephone Encounter (Signed)
Patient called to request a medication refill states she is back working her regular shifts and her skin is flaring up again.  triamcinolone cream (KENALOG) 0.1 %  mupirocin ointment (BACTROBAN) 2 %  clotrimazole (LOTRIMIN) 1 % cream   Patient has an appointment with a rheumatologist but does not have an appointment until January 2022  Patient uses [facility] on [address]   Please advice [phone]

## 2020-01-04 NOTE — Telephone Encounter (Signed)
Please advise.  These were last prescribed in July 2021.

## 2020-01-07 ENCOUNTER — Other Ambulatory Visit: Payer: Self-pay | Admitting: Internal Medicine

## 2020-01-07 DIAGNOSIS — L245 Irritant contact dermatitis due to other chemical products: Secondary | ICD-10-CM

## 2020-01-07 DIAGNOSIS — R21 Rash and other nonspecific skin eruption: Secondary | ICD-10-CM

## 2020-01-07 MED ORDER — CLOTRIMAZOLE 1 % EX CREA: 1.0000 "application " | TOPICAL_CREAM | Freq: Two times a day (BID) | CUTANEOUS | 1 refills | Status: DC

## 2020-01-07 MED ORDER — MUPIROCIN 2 % EX OINT
1.0000 | TOPICAL_OINTMENT | Freq: Two times a day (BID) | CUTANEOUS | 0 refills | Status: DC
Start: 2020-01-07 — End: 2020-02-03

## 2020-01-07 MED ORDER — TRIAMCINOLONE ACETONIDE 0.1 % EX CREA: 1.0000 "application " | TOPICAL_CREAM | Freq: Two times a day (BID) | CUTANEOUS | 3 refills | Status: DC

## 2020-01-07 NOTE — Telephone Encounter (Signed)
Sent in refills per patient request.   [name], D.O. Primary Care at [facility]  01/07/2020, 4:47 PM

## 2020-01-12 ENCOUNTER — Telehealth (INDEPENDENT_AMBULATORY_CARE_PROVIDER_SITE_OTHER): Payer: 59 | Admitting: Internal Medicine

## 2020-01-12 DIAGNOSIS — R21 Rash and other nonspecific skin eruption: Secondary | ICD-10-CM

## 2020-01-12 MED ORDER — PREDNISONE 20 MG PO TABS: 20.0000 mg | ORAL_TABLET | Freq: Every day | ORAL | 0 refills | Status: DC

## 2020-01-12 NOTE — Progress Notes (Signed)
Virtual Visit via MyChart Note  I connected with [name], on 01/12/2020 at 8:47 AM by MyChart due to the COVID-19 pandemic and verified that I am speaking with the correct person using two identifiers.   Consent: I discussed the limitations, risks, security and privacy concerns of performing an evaluation and management service by telephone and the availability of in person appointments. I also discussed with the patient that there may be a patient responsible charge related to this service. The patient expressed understanding and agreed to proceed.   Location of Patient: Home   Location of Provider: Clinic    Persons participating in Telemedicine visit: Kamaiya Antilla [facility] Dr. Juleen China    History of Present Illness: Patient reports that she has had another flare of her skin rash. She has a long standing history of chronic urticaria, venous stasis dermatitis, eczema. She has been seen by allergist with negative work up. Has been referred to rheum for elevated ESR but that appointment isn't until January. Is using topical steroid, fungal, and bacterial ointments without significant improvement. Has wide spread rash located over ankles, back, neck, elbows. Very itchy.    Past Medical History:  Diagnosis Date  . Angio-edema   . Eczema   . Urticaria   . Varicose veins of both lower extremities 09/19/2015  . Venous stasis dermatitis of right lower extremity 10/08/2015   No Known Allergies  Current Outpatient Medications on File Prior to Visit  Medication Sig Dispense Refill  . clotrimazole (LOTRIMIN) 1 % cream Apply 1 application topically 2 (two) times daily. 30 g 1  . fluconazole (DIFLUCAN) 150 MG tablet Take one tablet now. Take second tablet in 72 hours if still feeling symptoms. 2 tablet 0  . mupirocin ointment (BACTROBAN) 2 % Apply 1 application topically 2 (two) times daily. 30 g 0  . triamcinolone cream (KENALOG) 0.1 % Apply 1 application topically 2 (two)  times daily. 453.6 g 3  . [DISCONTINUED] ranitidine (ZANTAC) 150 MG tablet Take 1 tablet (150 mg total) by mouth 2 (two) times daily. Take until the rash/swelling resolves (Patient not taking: Reported on 04/08/2017) 30 tablet 0   No current facility-administered medications on file prior to visit.    Observations/Objective: NAD. Speaking clearly.  Work of breathing normal.  Alert and oriented. Mood appropriate.   Assessment and Plan: 1. Generalized rash Given diffuse involvement of skin and significant pruritis with her history, will treat with low dose systemic steroid. No evidence of secondary bacterila infection warranting PO abx at present. Continue topical ointments. Follow up with rheum as scheduled. Return precautions discussed.  - predniSONE (DELTASONE) 20 MG tablet; Take 1 tablet (20 mg total) by mouth daily with breakfast.  Dispense: 5 tablet; Refill: 0   Follow Up Instructions: PRN and for routine medical care    I discussed the assessment and treatment plan with the patient. The patient was provided an opportunity to ask questions and all were answered. The patient agreed with the plan and demonstrated an understanding of the instructions.   The patient was advised to call back or seek an in-person evaluation if the symptoms worsen or if the condition fails to improve as anticipated.     I provided 16 minutes total of non-face-to-face time during this encounter including median intraservice time, reviewing previous notes, investigations, ordering medications, medical decision making, coordinating care and patient verbalized understanding at the end of the visit.    Phill Myron, D.O. Primary Care at [facility]  01/12/2020, 8:47 AM

## 2020-02-03 ENCOUNTER — Encounter: Payer: Self-pay | Admitting: Physician Assistant

## 2020-02-03 ENCOUNTER — Ambulatory Visit (INDEPENDENT_AMBULATORY_CARE_PROVIDER_SITE_OTHER): Payer: 59 | Admitting: Physician Assistant

## 2020-02-03 ENCOUNTER — Other Ambulatory Visit: Payer: Self-pay

## 2020-02-03 VITALS — BP 147/100 | HR 111 | Temp 97.4°F | Resp 18 | Ht 64.0 in | Wt 132.0 lb

## 2020-02-03 DIAGNOSIS — L245 Irritant contact dermatitis due to other chemical products: Secondary | ICD-10-CM

## 2020-02-03 DIAGNOSIS — R030 Elevated blood-pressure reading, without diagnosis of hypertension: Secondary | ICD-10-CM

## 2020-02-03 DIAGNOSIS — F411 Generalized anxiety disorder: Secondary | ICD-10-CM

## 2020-02-03 DIAGNOSIS — L508 Other urticaria: Secondary | ICD-10-CM

## 2020-02-03 DIAGNOSIS — R21 Rash and other nonspecific skin eruption: Secondary | ICD-10-CM

## 2020-02-03 DIAGNOSIS — R000 Tachycardia, unspecified: Secondary | ICD-10-CM

## 2020-02-03 MED ORDER — CLOTRIMAZOLE 1 % EX CREA: 1.0000 "application " | TOPICAL_CREAM | Freq: Two times a day (BID) | CUTANEOUS | 1 refills | Status: AC

## 2020-02-03 MED ORDER — HYDROXYZINE HCL 10 MG PO TABS: 10.0000 mg | ORAL_TABLET | Freq: Three times a day (TID) | ORAL | 0 refills | Status: DC | PRN

## 2020-02-03 MED ORDER — TRIAMCINOLONE ACETONIDE 0.1 % EX CREA
1.0000 | TOPICAL_CREAM | Freq: Two times a day (BID) | CUTANEOUS | 3 refills | Status: AC
Start: 2020-02-03 — End: ?

## 2020-02-03 MED ORDER — SERTRALINE HCL 50 MG PO TABS: 50.0000 mg | ORAL_TABLET | Freq: Every day | ORAL | 3 refills | Status: AC

## 2020-02-03 MED ORDER — MUPIROCIN 2 % EX OINT: 1.0000 "application " | TOPICAL_OINTMENT | Freq: Two times a day (BID) | CUTANEOUS | 0 refills | Status: AC

## 2020-02-03 NOTE — Patient Instructions (Signed)
I encourage you to start Zoloft 50 mg once daily.  You can use hydroxyzine 10 mg three times a day to help with itching and relief of anxiety.  I encourage you to follow up in the [facility] in 4 weeks  I hope that you feel better soon!  Roney Jaffe, PA-C Physician Assistant [facility] [email or web address]   Managing Anxiety, Adult After being diagnosed with an anxiety disorder, you may be relieved to know why you have felt or behaved a certain way. You may also feel overwhelmed about the treatment ahead and what it will mean for your life. With care and support, you can manage this condition and recover from it. How to manage lifestyle changes Managing stress and anxiety  Stress is your body's reaction to life changes and events, both good and bad. Most stress will last just a few hours, but stress can be ongoing and can lead to more than just stress. Although stress can play a major role in anxiety, it is not the same as anxiety. Stress is usually caused by something external, such as a deadline, test, or competition. Stress normally passes after the triggering event has ended.  Anxiety is caused by something internal, such as imagining a terrible outcome or worrying that something will go wrong that will devastate you. Anxiety often does not go away even after the triggering event is over, and it can become long-term (chronic) worry. It is important to understand the differences between stress and anxiety and to manage your stress effectively so that it does not lead to an anxious response. Talk with your health care provider or a counselor to learn more about reducing anxiety and stress. He or she may suggest tension reduction techniques, such as:  Music therapy. This can include creating or listening to music that you enjoy and that inspires you.  Mindfulness-based meditation. This involves being aware of your  normal breaths while not trying to control your breathing. It can be done while sitting or walking.  Centering prayer. This involves focusing on a word, phrase, or sacred image that means something to you and brings you peace.  Deep breathing. To do this, expand your stomach and inhale slowly through your nose. Hold your breath for 3-5 seconds. Then exhale slowly, letting your stomach muscles relax.  Self-talk. This involves identifying thought patterns that lead to anxiety reactions and changing those patterns.  Muscle relaxation. This involves tensing muscles and then relaxing them. Choose a tension reduction technique that suits your lifestyle and personality. These techniques take time and practice. Set aside 5-15 minutes a day to do them. Therapists can offer counseling and training in these techniques. The training to help with anxiety may be covered by some insurance plans. Other things you can do to manage stress and anxiety include:  Keeping a stress/anxiety diary. This can help you learn what triggers your reaction and then learn ways to manage your response.  Thinking about how you react to certain situations. You may not be able to control everything, but you can control your response.  Making time for activities that help you relax and not feeling guilty about spending your time in this way.  Visual imagery and yoga can help you stay calm and relax.  Medicines Medicines can help ease symptoms. Medicines for anxiety include:  Anti-anxiety drugs.  Antidepressants. Medicines are often used as a primary treatment for anxiety disorder. Medicines will be prescribed by a health care provider.  When used together, medicines, psychotherapy, and tension reduction techniques may be the most effective treatment. Relationships Relationships can play a big part in helping you recover. Try to spend more time connecting with trusted friends and family members. Consider going to couples  counseling, taking family education classes, or going to family therapy. Therapy can help you and others better understand your condition. How to recognize changes in your anxiety Everyone responds differently to treatment for anxiety. Recovery from anxiety happens when symptoms decrease and stop interfering with your daily activities at home or work. This may mean that you will start to:  Have better concentration and focus. Worry will interfere less in your daily thinking.  Sleep better.  Be less irritable.  Have more energy.  Have improved memory. It is important to recognize when your condition is getting worse. Contact your health care provider if your symptoms interfere with home or work and you feel like your condition is not improving. Follow these instructions at home: Activity  Exercise. Most adults should do the following: ? Exercise for at least 150 minutes each week. The exercise should increase your heart rate and make you sweat (moderate-intensity exercise). ? Strengthening exercises at least twice a week.  Get the right amount and quality of sleep. Most adults need 7-9 hours of sleep each night. Lifestyle   Eat a healthy diet that includes plenty of vegetables, fruits, whole grains, low-fat dairy products, and lean protein. Do not eat a lot of foods that are high in solid fats, added sugars, or salt.  Make choices that simplify your life.  Do not use any products that contain nicotine or tobacco, such as cigarettes, e-cigarettes, and chewing tobacco. If you need help quitting, ask your health care provider.  Avoid caffeine, alcohol, and certain over-the-counter cold medicines. These may make you feel worse. Ask your pharmacist which medicines to avoid. General instructions  Take over-the-counter and prescription medicines only as told by your health care provider.  Keep all follow-up visits as told by your health care provider. This is important. Where to find  support You can get help and support from these sources:  Self-help groups.  Online and [employer].  A trusted spiritual leader.  Couples counseling.  Family education classes.  Family therapy. Where to find more information You may find that joining a support group helps you deal with your anxiety. The following sources can help you locate counselors or support groups near you:  Mental Health America: www.mentalhealthamerica.net  Anxiety and Depression Association of [place] (ADAA): ProgramCam.de  [employer] on Mental Illness (NAMI): www.nami.org Contact a health care provider if you:  Have a hard time staying focused or finishing daily tasks.  Spend many hours a day feeling worried about everyday life.  Become exhausted by worry.  Start to have headaches, feel tense, or have nausea.  Urinate more than normal.  Have diarrhea. Get help right away if you have:  A racing heart and shortness of breath.  Thoughts of hurting yourself or others. If you ever feel like you may hurt yourself or others, or have thoughts about taking your own life, get help right away. You can go to your nearest emergency department or call:  Your local emergency services (911 in the [place]  A suicide crisis helpline, such as the National Suicide Prevention Lifeline at 484 868 6757. This is open 24 hours a day. Summary  Taking steps to learn and use tension reduction techniques can help calm you and help prevent triggering  an anxiety reaction.  When used together, medicines, psychotherapy, and tension reduction techniques may be the most effective treatment.  Family, friends, and partners can play a big part in helping you recover from an anxiety disorder. This information is not intended to replace advice given to you by your health care provider. Make sure you discuss any questions you have with your health care provider. Document Revised: 08/12/2018 Document Reviewed:  08/12/2018 Elsevier Patient Education  2020 [employer].

## 2020-02-03 NOTE — Progress Notes (Signed)
Established Patient Office Visit  Subjective:  Patient ID: [name], female    DOB: 08/17/1975  Age: 44 y.o. MRN: [id number]  CC:  Chief Complaint  Patient presents with  . Joint Swelling  . Rash    HPI [name] reports that she is having any continued breakout on her lower back as well as a new breakout on her right wrist.  Request refills of her creams, states they did offer relief temporarily.  Reports that she does have upcoming appointment with endocrinology for further evaluation.  Reports that she has been having swollen ankles, does endorse diagnosis of chronic vein insufficiency approximately 3 years ago.  Reports that she does wear compression stockings most days, but does stand on her feet all day  Does question whether her rashes could be attributed to her stress and anxiety.  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  Reports that she does sleep well, gets approximately 8 hours of sleep, although attributes that to being exhausted by the end of the day.    Past Medical History:  Diagnosis Date  . Angio-edema   . Eczema   . Urticaria   . Varicose veins of both lower extremities 09/19/2015  . Venous stasis dermatitis of right lower extremity 10/08/2015    History reviewed. No pertinent surgical history.  Family History  Problem Relation Age of Onset  . Hypertension Mother   . Edema Sister        leg   . Allergic rhinitis Neg Hx   . Angioedema Neg Hx   . Asthma Neg Hx   . Atopy Neg Hx   . Eczema Neg Hx   . Immunodeficiency Neg Hx     Social History   Socioeconomic History  . Marital status: Single    Spouse name: Not on file  . Number of children: Not on file  . Years of education: Not on file  . Highest education level: Not on file  Occupational History  . Not on file  Tobacco Use  . Smoking status: Never Smoker  .  Smokeless tobacco: Never Used  Vaping Use  . Vaping Use: Never used  Substance and Sexual Activity  . Alcohol use: No  . Drug use: No  . Sexual activity: Not Currently  Other Topics Concern  . Not on file  Social History Narrative  . Not on file   Social Determinants of Health   Financial Resource Strain:   . Difficulty of Paying Living Expenses: Not on file  Food Insecurity:   . Worried About Programme researcher, broadcasting/film/video in the Last Year: Not on file  . Ran Out of Food in the Last Year: Not on file  Transportation Needs:   . Lack of Transportation (Medical): Not on file  . Lack of Transportation (Non-Medical): Not on file  Physical Activity:   . Days of Exercise per Week: Not on file  . Minutes of Exercise per Session: Not on file  Stress:   . Feeling of Stress : Not on file  Social Connections:   . Frequency of Communication with Friends and Family: Not on file  . Frequency of Social Gatherings with Friends and Family: Not on file  . Attends Religious Services: Not on file  . Active Member of Clubs or Organizations: Not on file  . Attends Banker Meetings: Not  on file  . Marital Status: Not on file  Intimate Partner Violence:   . Fear of Current or Ex-Partner: Not on file  . Emotionally Abused: Not on file  . Physically Abused: Not on file  . Sexually Abused: Not on file    Outpatient Medications Prior to Visit  Medication Sig Dispense Refill  . clotrimazole (LOTRIMIN) 1 % cream Apply 1 application topically 2 (two) times daily. 30 g 1  . mupirocin ointment (BACTROBAN) 2 % Apply 1 application topically 2 (two) times daily. 30 g 0  . triamcinolone cream (KENALOG) 0.1 % Apply 1 application topically 2 (two) times daily. 453.6 g 3  . fluconazole (DIFLUCAN) 150 MG tablet Take one tablet now. Take second tablet in 72 hours if still feeling symptoms. 2 tablet 0  . predniSONE (DELTASONE) 20 MG tablet Take 1 tablet (20 mg total) by mouth daily with breakfast. 5 tablet 0    No facility-administered medications prior to visit.    No Known Allergies  ROS Review of Systems  Constitutional: Negative.   HENT: Negative.   Eyes: Negative.   Respiratory: Negative.   Cardiovascular: Positive for leg swelling.  Gastrointestinal: Negative.   Endocrine: Negative.   Genitourinary: Negative.   Musculoskeletal: Negative.   Skin: Positive for rash.  Allergic/Immunologic: Negative.   Neurological: Negative.   Hematological: Negative.   Psychiatric/Behavioral: Negative for self-injury and sleep disturbance. The patient is nervous/anxious.       Objective:    Physical Exam Vitals and nursing note reviewed.  Constitutional:      General: She is not in acute distress.    Appearance: Normal appearance. She is not ill-appearing.  HENT:     Right Ear: External ear normal.     Left Ear: External ear normal.     Nose: Nose normal.  Cardiovascular:     Rate and Rhythm: Regular rhythm. Tachycardia present.     Pulses: Normal pulses.     Heart sounds: Normal heart sounds.  Pulmonary:     Effort: Pulmonary effort is normal.     Breath sounds: Normal breath sounds.  Abdominal:     General: Abdomen is flat.     Palpations: Abdomen is soft.  Musculoskeletal:     Cervical back: Normal range of motion and neck supple.     Right lower leg: 1+ Edema present.     Left lower leg: 1+ Edema present.  Skin:    Findings: Rash present. Rash is crusting and urticarial.     Comments: Noted lower back, right wrist  Neurological:     General: No focal deficit present.     Mental Status: She is alert and oriented to person, place, and time.  Psychiatric:        Attention and Perception: Attention normal.        Mood and Affect: Mood is anxious. Affect is tearful.        Speech: Speech normal.        Behavior: Behavior normal.        Thought Content: Thought content does not include homicidal or suicidal ideation.        Cognition and Memory: Cognition normal.         Judgment: Judgment normal.     BP (!) 147/100 (BP Location: Left Arm, Patient Position: Sitting, Cuff Size: Normal)   Pulse (!) 111   Temp (!) 97.4 F (36.3 C) (Oral)   Resp 18   Ht 5\' 4"  (1.626 m)   Wt  132 lb (59.9 kg)   SpO2 99%   BMI 22.66 kg/m  Wt Readings from Last 3 Encounters:  02/03/20 132 lb (59.9 kg)  11/12/19 132 lb (59.9 kg)  09/20/19 141 lb (64 kg)     There are no preventive care reminders to display for this patient.  There are no preventive care reminders to display for this patient.  Lab Results  Component Value Date   TSH 1.810 04/09/2017   Lab Results  Component Value Date   WBC 3.8 11/12/2019   HGB 10.4 (L) 11/12/2019   HCT 35.0 11/12/2019   MCV 83 11/12/2019   PLT 304 11/12/2019   Lab Results  Component Value Date   NA 137 11/12/2019   K 4.5 11/12/2019   CO2 23 11/12/2019   GLUCOSE 89 11/12/2019   BUN 8 11/12/2019   CREATININE 0.73 11/12/2019   BILITOT 0.2 11/12/2019   ALKPHOS 84 11/12/2019   AST 24 11/12/2019   ALT 13 11/12/2019   PROT 7.9 11/12/2019   ALBUMIN 4.6 11/12/2019   CALCIUM 9.2 11/12/2019   ANIONGAP 8 09/28/2015   Lab Results  Component Value Date   CHOL 222 (H) 11/12/2019   Lab Results  Component Value Date   HDL 92 11/12/2019   Lab Results  Component Value Date   LDLCALC 120 (H) 11/12/2019   Lab Results  Component Value Date   TRIG 57 11/12/2019   Lab Results  Component Value Date   CHOLHDL 2.4 11/12/2019   Lab Results  Component Value Date   HGBA1C 4.8 04/02/2016      Assessment & Plan:   Problem List Items Addressed This Visit      Musculoskeletal and Integument   Irritant contact dermatitis due to chemical   Relevant Medications   triamcinolone cream (KENALOG) 0.1 %   Chronic urticaria - Primary   Relevant Orders   Ambulatory referral to Dermatology   Rash and nonspecific skin eruption   Relevant Medications   hydrOXYzine (ATARAX/VISTARIL) 10 MG tablet   clotrimazole (LOTRIMIN) 1 %  cream   mupirocin ointment (BACTROBAN) 2 %   Other Relevant Orders   Ambulatory referral to Dermatology     Other   Anxiety state   Relevant Medications   sertraline (ZOLOFT) 50 MG tablet   hydrOXYzine (ATARAX/VISTARIL) 10 MG tablet   Elevated blood pressure reading in office without diagnosis of hypertension   Tachycardia      Meds ordered this encounter  Medications  . sertraline (ZOLOFT) 50 MG tablet    Sig: Take 1 tablet (50 mg total) by mouth daily.    Dispense:  30 tablet    Refill:  3    Order Specific Question:   Supervising Provider    Answer:   Delford FieldWRIGHT, PATRICK E [[id number]]  . hydrOXYzine (ATARAX/VISTARIL) 10 MG tablet    Sig: Take 1 tablet (10 mg total) by mouth 3 (three) times daily as needed.    Dispense:  30 tablet    Refill:  0    Order Specific Question:   Supervising Provider    Answer:   Delford FieldWRIGHT, PATRICK E [[id number]]  . clotrimazole (LOTRIMIN) 1 % cream    Sig: Apply 1 application topically 2 (two) times daily.    Dispense:  30 g    Refill:  1    Order Specific Question:   Supervising Provider    Answer:   WRIGHT, PATRICK E [[id number]]  . mupirocin ointment (BACTROBAN) 2 %    Sig: Apply  1 application topically 2 (two) times daily.    Dispense:  30 g    Refill:  0    Order Specific Question:   Supervising Provider    Answer:   Shan Levans E [[id number]]  . triamcinolone cream (KENALOG) 0.1 %    Sig: Apply 1 application topically 2 (two) times daily.    Dispense:  453.6 g    Refill:  3    Order Specific Question:   Supervising Provider    Answer:   WRIGHT, PATRICK E [[id number]]  1. Chronic urticaria Continue with referral to rheumatology as well - Ambulatory referral to Dermatology  2. Rash and nonspecific skin eruption Resume current regimen, trial hydroxyzine 10 mg every 8 hours as needed for pruritus - Ambulatory referral to Dermatology - hydrOXYzine (ATARAX/VISTARIL) 10 MG tablet; Take 1 tablet (10 mg total) by mouth 3 (three) times daily as needed.  Dispense: 30  tablet; Refill: 0 - clotrimazole (LOTRIMIN) 1 % cream; Apply 1 application topically 2 (two) times daily.  Dispense: 30 g; Refill: 1 - mupirocin ointment (BACTROBAN) 2 %; Apply 1 application topically 2 (two) times daily.  Dispense: 30 g; Refill: 0  3. Irritant contact dermatitis due to chemical  - triamcinolone cream (KENALOG) 0.1 %; Apply 1 application topically 2 (two) times daily.  Dispense: 453.6 g; Refill: 3  4. Anxiety state Trial Zoloft 50 mg, hydroxyzine 10 mg every 8 hours as needed.  Patient education given on stress management, patient declines referral for CBT at this time - sertraline (ZOLOFT) 50 MG tablet; Take 1 tablet (50 mg total) by mouth daily.  Dispense: 30 tablet; Refill: 3 - hydrOXYzine (ATARAX/VISTARIL) 10 MG tablet; Take 1 tablet (10 mg total) by mouth 3 (three) times daily as needed.  Dispense: 30 tablet; Refill: 0  5.  Elevated blood pressure without diagnosis of hypertension  Encourage patient to check blood pressure at home, keep written log, notify office of continued elevated readings  6.  Tachycardia Encouraged patient to check pulse at home, keep written log, notify office of continued elevated readings.  Recent thyroid lab within normal limits. I have reviewed the patient's medical history (PMH, PSH, Social History, Family History, Medications, and allergies) , and have been updated if relevant. I spent 30 minutes reviewing chart and  face to face time with patient.     Follow-up: Return in about 4 weeks (around 03/02/2020).    Alison Knudsen Mayers, PA-C

## 2020-02-04 DIAGNOSIS — F411 Generalized anxiety disorder: Secondary | ICD-10-CM | POA: Insufficient documentation

## 2020-02-04 DIAGNOSIS — L508 Other urticaria: Secondary | ICD-10-CM | POA: Insufficient documentation

## 2020-02-04 DIAGNOSIS — R21 Rash and other nonspecific skin eruption: Secondary | ICD-10-CM | POA: Insufficient documentation

## 2020-02-04 DIAGNOSIS — R030 Elevated blood-pressure reading, without diagnosis of hypertension: Secondary | ICD-10-CM | POA: Insufficient documentation

## 2020-02-04 DIAGNOSIS — R000 Tachycardia, unspecified: Secondary | ICD-10-CM | POA: Insufficient documentation

## 2020-03-10 ENCOUNTER — Other Ambulatory Visit: Payer: Self-pay | Admitting: Physician Assistant

## 2020-03-10 DIAGNOSIS — R21 Rash and other nonspecific skin eruption: Secondary | ICD-10-CM

## 2020-03-10 DIAGNOSIS — F411 Generalized anxiety disorder: Secondary | ICD-10-CM

## 2020-03-27 NOTE — Progress Notes (Deleted)
Office Visit Note  Patient: [name]             Date of Birth: Feb 25, 1976           MRN: [id number]             PCP: Nicolette Bang, DO Referring: Caryl Never* Visit Date: 04/01/2020 Occupation: _0 @  Subjective:  No chief complaint on file.   History of Present Illness: [name] is a 45 y.o. female ***   Activities of Daily Living:  Patient reports morning stiffness for *** {minute/hour:19697}.   Patient {ACTIONS;DENIES/REPORTS:21021675::"Denies"} nocturnal pain.  Difficulty dressing/grooming: {ACTIONS;DENIES/REPORTS:21021675::"Denies"} Difficulty climbing stairs: {ACTIONS;DENIES/REPORTS:21021675::"Denies"} Difficulty getting out of chair: {ACTIONS;DENIES/REPORTS:21021675::"Denies"} Difficulty using hands for taps, buttons, cutlery, and/or writing: {ACTIONS;DENIES/REPORTS:21021675::"Denies"}  No Rheumatology ROS completed.   PMFS History:  Patient Active Problem List   Diagnosis Date Noted  . Chronic urticaria 02/04/2020  . Rash and nonspecific skin eruption 02/04/2020  . Anxiety state 02/04/2020  . Elevated blood pressure reading in office without diagnosis of hypertension 02/04/2020  . Tachycardia 02/04/2020  . Venous stasis dermatitis of right lower extremity 10/08/2015  . Irritant contact dermatitis due to chemical 10/08/2015  . Varicose veins of leg with edema 09/19/2015    Past Medical History:  Diagnosis Date  . Angio-edema   . Eczema   . Urticaria   . Varicose veins of both lower extremities 09/19/2015  . Venous stasis dermatitis of right lower extremity 10/08/2015    Family History  Problem Relation Age of Onset  . Hypertension Mother   . Edema Sister        leg   . Allergic rhinitis Neg Hx   . Angioedema Neg Hx   . Asthma Neg Hx   . Atopy Neg Hx   . Eczema Neg Hx   . Immunodeficiency Neg Hx    No past surgical history on file. Social History   Social History Narrative  . Not on file   Immunization History   Administered Date(s) Administered  . Influenza,inj,Quad PF,6+ Mos 04/02/2016  . Tdap 04/02/2016     Objective: Vital Signs: There were no vitals taken for this visit.   Physical Exam   Musculoskeletal Exam: ***  CDAI Exam: CDAI Score: -- Patient Global: --; Provider Global: -- Swollen: --; Tender: -- Joint Exam 04/01/2020   No joint exam has been documented for this visit   There is currently no information documented on the homunculus. Go to the Rheumatology activity and complete the homunculus joint exam.  Investigation: No additional findings.  Imaging: No results found.  Recent Labs: Lab Results  Component Value Date   WBC 3.8 11/12/2019   HGB 10.4 (L) 11/12/2019   PLT 304 11/12/2019   NA 137 11/12/2019   K 4.5 11/12/2019   CL 101 11/12/2019   CO2 23 11/12/2019   GLUCOSE 89 11/12/2019   BUN 8 11/12/2019   CREATININE 0.73 11/12/2019   BILITOT 0.2 11/12/2019   ALKPHOS 84 11/12/2019   AST 24 11/12/2019   ALT 13 11/12/2019   PROT 7.9 11/12/2019   ALBUMIN 4.6 11/12/2019   CALCIUM 9.2 11/12/2019   GFRAA 114 11/12/2019    Speciality Comments: No specialty comments available.  Procedures:  No procedures performed Allergies: Patient has no known allergies.   Assessment / Plan:     Visit Diagnoses: Elevated sed rate - 12/18/19: ESR 83, RF 10.8, CRP<1, ANA negative, thyroglobulin ab-, thyroperoxidase ab-. 01/15/20: IgG 1,769, IgA 347, IgM 134, ESR 81  Chronic urticaria  Venous stasis dermatitis of right lower extremity  Varicose veins of both legs with edema  Orders: No orders of the defined types were placed in this encounter.  No orders of the defined types were placed in this encounter.   Face-to-face time spent with patient was *** minutes. Greater than 50% of time was spent in counseling and coordination of care.  Follow-Up Instructions: No follow-ups on file.   Ofilia Neas, PA-C  Note - This record has been created using Dragon  software.  Chart creation errors have been sought, but may not always  have been located. Such creation errors do not reflect on  the standard of medical care.

## 2020-04-01 ENCOUNTER — Ambulatory Visit: Payer: 59 | Admitting: Rheumatology

## 2020-04-01 DIAGNOSIS — I872 Venous insufficiency (chronic) (peripheral): Secondary | ICD-10-CM

## 2020-04-01 DIAGNOSIS — R700 Elevated erythrocyte sedimentation rate: Secondary | ICD-10-CM

## 2020-04-01 DIAGNOSIS — I83893 Varicose veins of bilateral lower extremities with other complications: Secondary | ICD-10-CM

## 2020-04-01 DIAGNOSIS — L508 Other urticaria: Secondary | ICD-10-CM

## 2020-04-22 ENCOUNTER — Ambulatory Visit: Payer: 59 | Admitting: Rheumatology

## 2020-05-31 ENCOUNTER — Ambulatory Visit: Payer: 59 | Admitting: Dermatology

## 2020-10-18 ENCOUNTER — Other Ambulatory Visit: Payer: Self-pay

## 2020-10-18 ENCOUNTER — Ambulatory Visit (INDEPENDENT_AMBULATORY_CARE_PROVIDER_SITE_OTHER): Payer: Self-pay | Admitting: Dermatology

## 2020-10-18 ENCOUNTER — Encounter: Payer: Self-pay | Admitting: Dermatology

## 2020-10-18 DIAGNOSIS — I872 Venous insufficiency (chronic) (peripheral): Secondary | ICD-10-CM

## 2020-10-18 DIAGNOSIS — L309 Dermatitis, unspecified: Secondary | ICD-10-CM

## 2020-10-18 MED ORDER — CLOBETASOL PROPIONATE 0.05 % EX CREA: 1.0000 "application " | TOPICAL_CREAM | Freq: Two times a day (BID) | CUTANEOUS | 0 refills | Status: AC

## 2020-10-18 NOTE — Patient Instructions (Addendum)
Chronic edema mainly right lower extremity typical of venous stasis.  Good arterial pulses in both feet.  Large patches of lichenoid dermatitis most notable on the right lower shin, the left anterior lower thigh, the lumbosacral area, below the umbilicus, and scattered on the arms.  Differential diagnosis includes stasis dermatitis, nummular eczema, id reaction, nickel hypersensitivity.  We will use clobetasol plus or minus a moist wrap on the areas of active inflammation except on the face.  We will instruct her on doing a nickel patch test at home.  Discussed ways to minimize venous pressure.  Follow-up by phone or MyChart in 3 to 4 weeks.  Dupilumab injection What is this medication? DUPILUMAB (doo PIL ue mab) is an injection used to treat certain patients witheczema, asthma, and sinus inflammation with nasal polyps. This medicine may be used for other purposes; ask your health care provider orpharmacist if you have questions. COMMON BRAND NAME(S): DUPIXENT What should I tell my care team before I take this medication? They need to know if you have any of these conditions: asthma parasitic (helminth) infection an unusual or allergic reaction to dupilumab, other medicines, foods, dyes, or preservatives pregnant or trying to get pregnant breast-feeding How should I use this medication? This medicine is injected under the skin or into a muscle. You will be taught how to prepare and give it. Take it as directed on the prescription label. Keeptaking it unless your health care provider tells you to stop. If you use a pen, be sure to take off the outer needle cover before using the dose. It is important that you put your used needles and syringes in a special sharps container. Do not put them in a trash can. If you do not have a sharpscontainer, call your pharmacist or healthcare provider to get one. A patient package insert for the product will be given with each prescription and refill. Be sure to  read this information carefully each time. The sheet maychange often. This medicine comes with INSTRUCTIONS FOR USE. Ask your pharmacist for directions on how to use this medicine. Read the information carefully. Talk toyour pharmacist or health care provider if you have questions. Talk to your pediatrician regarding the use of this medicine in children. While this medicine may be prescribed for children as young as 6 years for selectedconditions, precautions do apply. Overdosage: If you think you have taken too much of this medicine contact apoison control center or emergency room at once. NOTE: This medicine is only for you. Do not share this medicine with others. What if I miss a dose? It is important not to miss any doses. Talk to your health care provider aboutwhat to do if you miss a dose. What may interact with this medication? vaccines warfarin This list may not describe all possible interactions. Give your health care provider a list of all the medicines, herbs, non-prescription drugs, or dietary supplements you use. Also tell them if you smoke, drink alcohol, or use illegaldrugs. Some items may interact with your medicine. What should I watch for while using this medication? Tell your doctor or healthcare professional if your symptoms do not start toget better or if they get worse. You should not receive certain vaccines while using this medicine. Dupilumab may prevent a vaccine from working. Talk to your health care provider if youneed to receive a vaccine while using this medicine. What side effects may I notice from receiving this medication? Side effects that you should report to your care team  as soon as possible: Allergic reactions-skin rash, itching, hives, swelling of the face, lips, tongue, or throat Change in vision such as blurry vision, seeing halos around lights, vision loss New or worsening eye pain, redness, irritation, or discharge Side effects that usually do not require  medical attention (report these toyour care team if they continue or are bothersome): Cold sore Dry eyes Joint pain Pain, redness, or irritation at injection site This list may not describe all possible side effects. Call your doctor for medical advice about side effects. You may report side effects to FDA at1-800-FDA-1088. Where should I keep my medication? Keep out of the reach of children and pets. Store in a refrigerator or at room temperature between 20 and 25 degrees C (68and 77 degrees F).  Refrigeration (preferred): Store in the refrigerator. Protect from light. Keep this drug in the original container until you are ready to take it. Throw awayany unused drug after the expiration date.  Room Temperature: This drug may be stored at room temperature for up to 14 days. Keep this drug in the original container until you are ready to take it. If it is stored at room temperature, throw away any unused drug after 14 daysor after it expires, whichever is first. NOTE: This sheet is a summary. It may not cover all possible information. If you have questions about this medicine, talk to your doctor, pharmacist, orhealth care provider.  2022 Elsevier/Gold Standard (2020-04-04 15:44:43)

## 2020-11-03 ENCOUNTER — Encounter: Payer: Self-pay | Admitting: Dermatology

## 2020-11-03 NOTE — Progress Notes (Signed)
    New Patient   Subjective  [name] is a 45 y.o. female who presents for the following: Skin Problem (Full body flare x 1 year ago also seen allergiest DR Dellis Anes = topical treatments TAC 1%, MUPIROCIN  OINT, HYDROXYZINE, CLOTRIMAZOLE. Patient stated she does wear compression hose daily due to standing long periods at work.).  Rashes on multiple areas Location:  Duration:  Quality:  Associated Signs/Symptoms: Modifying Factors:  Severity:  Timing: Context:    The following portions of the chart were reviewed this encounter and updated as appropriate:  Tobacco  Allergies  Meds  Problems  Med Hx  Surg Hx  Fam Hx      Objective  Well appearing patient in no apparent distress; mood and affect are within normal limits. Right Lower Leg - Anterior Bilateral lower leg edema, mild to moderate active patchy inflammation (compatible with stasis or nummular eczema or nickel with id reaction), mixed postinflammatory hyperpigmentation with some hemosiderosis.  Left Suprapubic Area, Mid Back Nummular patches of chronic eczema and PIH not limited to the lower leg; this could represent independent nummular eczema, id reaction, or systemic diabetes contact dermatitis to nickel (some periumbilical involvement) (.  Will give patient a 4 sided band aid to do a nickel test at home.     A focused examination was performed including head, neck, trunk, arms, legs.. Relevant physical exam findings are noted in the Assessment and Plan.   Assessment & Plan  Venous stasis dermatitis of right lower extremity Right Lower Leg - Anterior  Topical clobetasol daily after bathing for maximum 30 days; if improvement is noted, cut back on use.  Discussed ways to minimize lower extremity venous pressure.  Encouraged to discuss proper use of surgical weight hose with her primary care physician.  Venous stasis dermatitis of left lower extremity Left Lower Leg - Anterior  Eczema, unspecified  type Left Suprapubic Area; Mid Back  Related Medications clobetasol cream (TEMOVATE) 0.05 % Apply 1 application topically 2 (two) times daily.

## 2020-11-29 ENCOUNTER — Telehealth: Payer: Self-pay | Admitting: Dermatology

## 2020-11-29 NOTE — Telephone Encounter (Signed)
Patient wanted to report that the nickel test was negative and she did not react. She R/S her follow up appointment for October and states that she will have pharmacy send refill request if she needs more Clobetasol before then.

## 2020-11-30 ENCOUNTER — Ambulatory Visit: Payer: Self-pay | Admitting: Dermatology

## 2020-12-07 NOTE — Telephone Encounter (Signed)
Per ST chart that patient's nickel test was negative.

## 2021-01-12 ENCOUNTER — Ambulatory Visit: Payer: Self-pay | Admitting: Dermatology

## 2021-08-19 IMAGING — MG DIGITAL DIAGNOSTIC BILAT W/ TOMO W/ CAD
6 of 10 series · 6 of 30 positions shown · non-contrast
Comparison: 09/12/2016.

CLINICAL DATA: Patient presents with a palpable lump in the upper
outer right breast.

EXAM:
DIGITAL DIAGNOSTIC BILATERAL MAMMOGRAM WITH CAD AND TOMO
ULTRASOUND RIGHT BREAST

[L CC synth-2D]
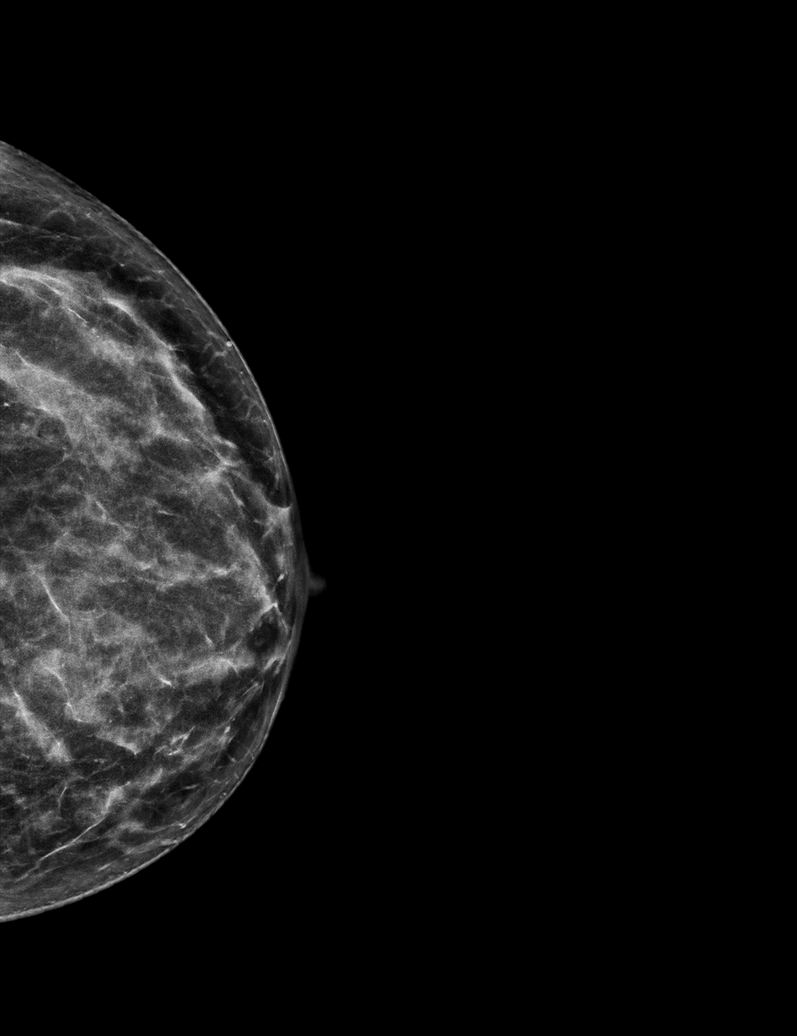

[L MLO synth-2D]
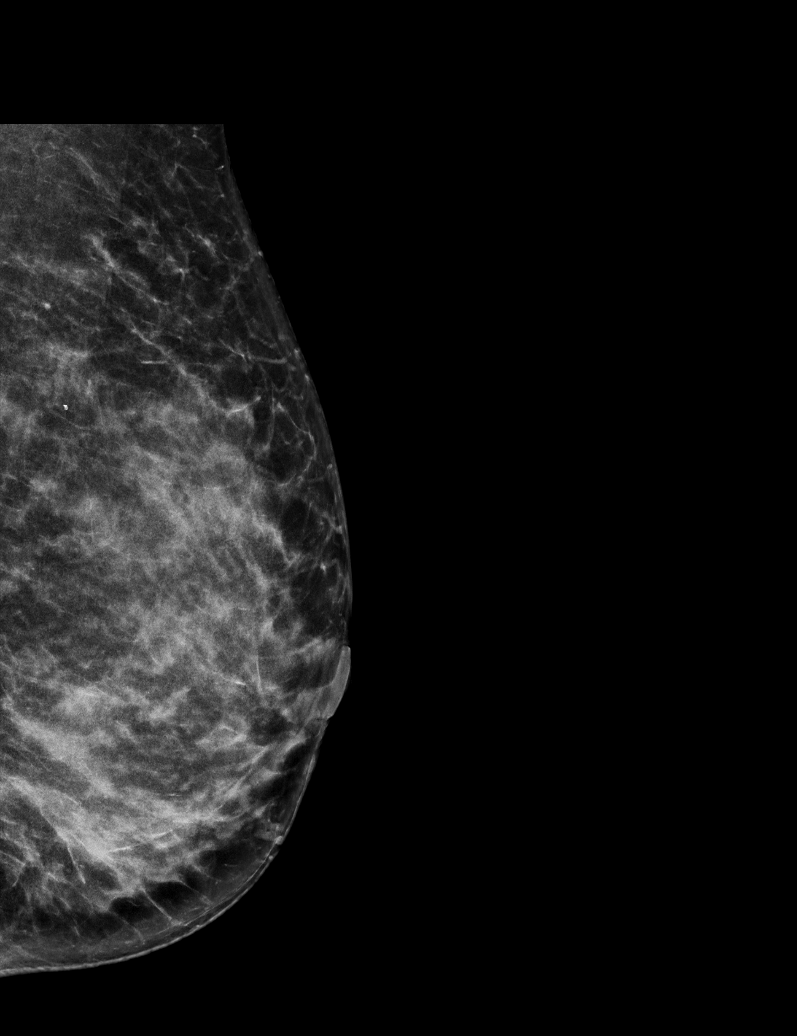

[R MLO synth-2D]
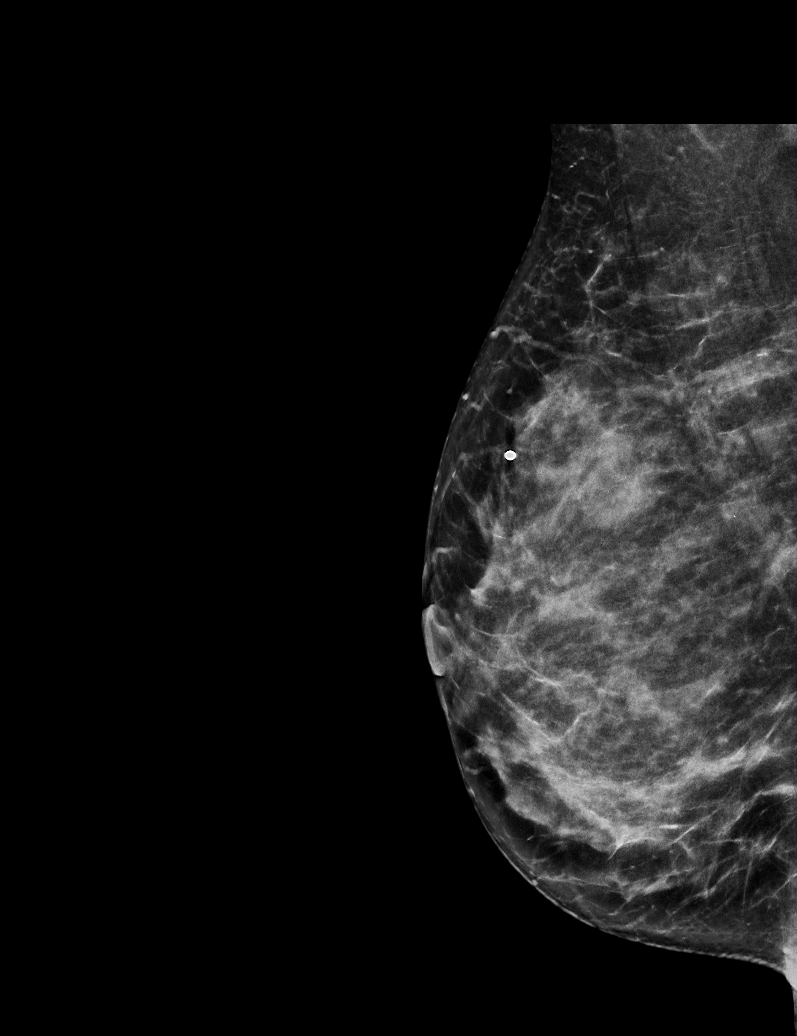

[R CC synth-2D]
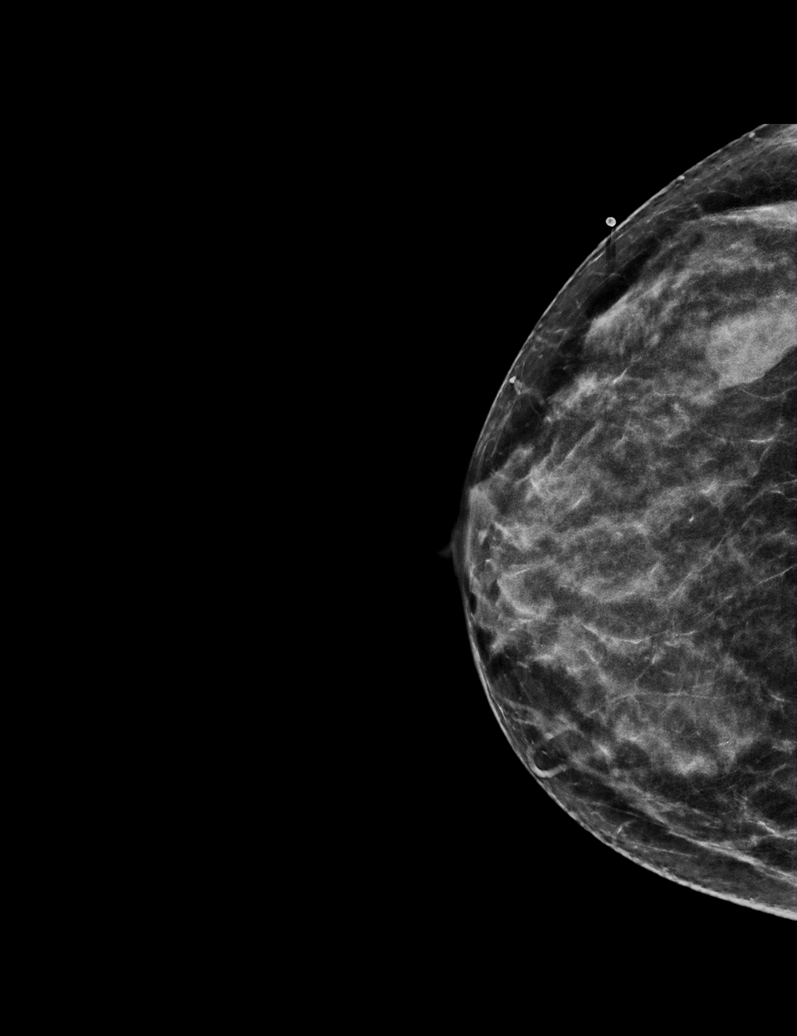

[R TAN synth-2D]
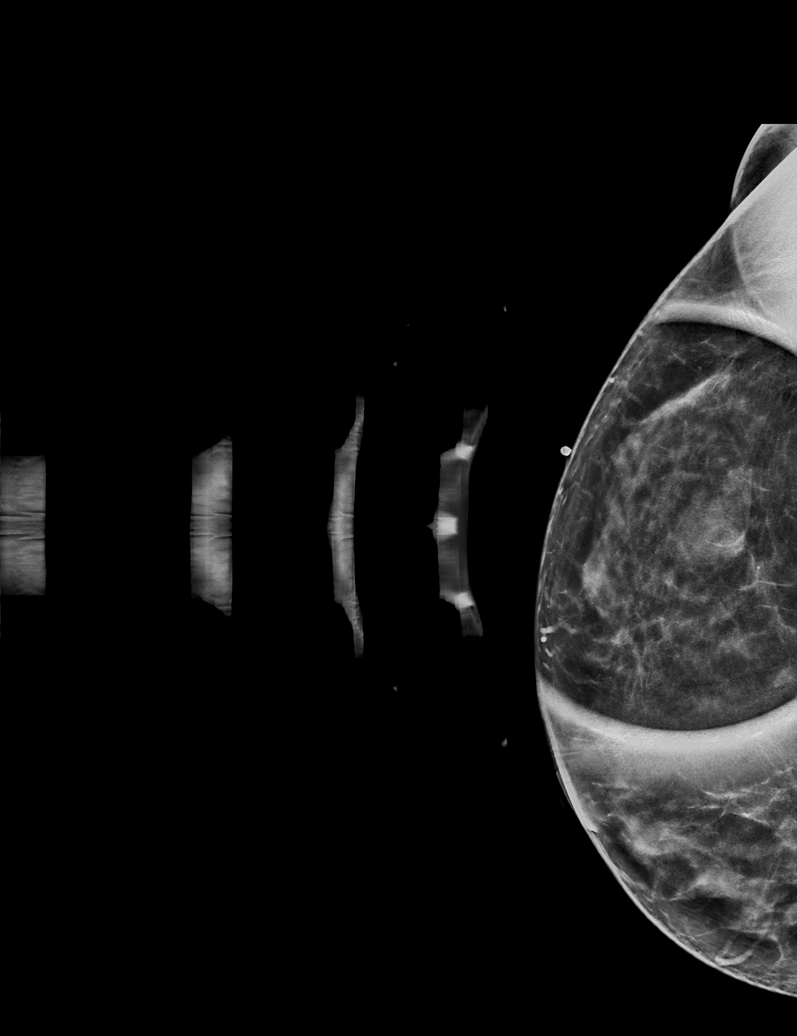

[R CC tomo · tomo slice 29/57.0]
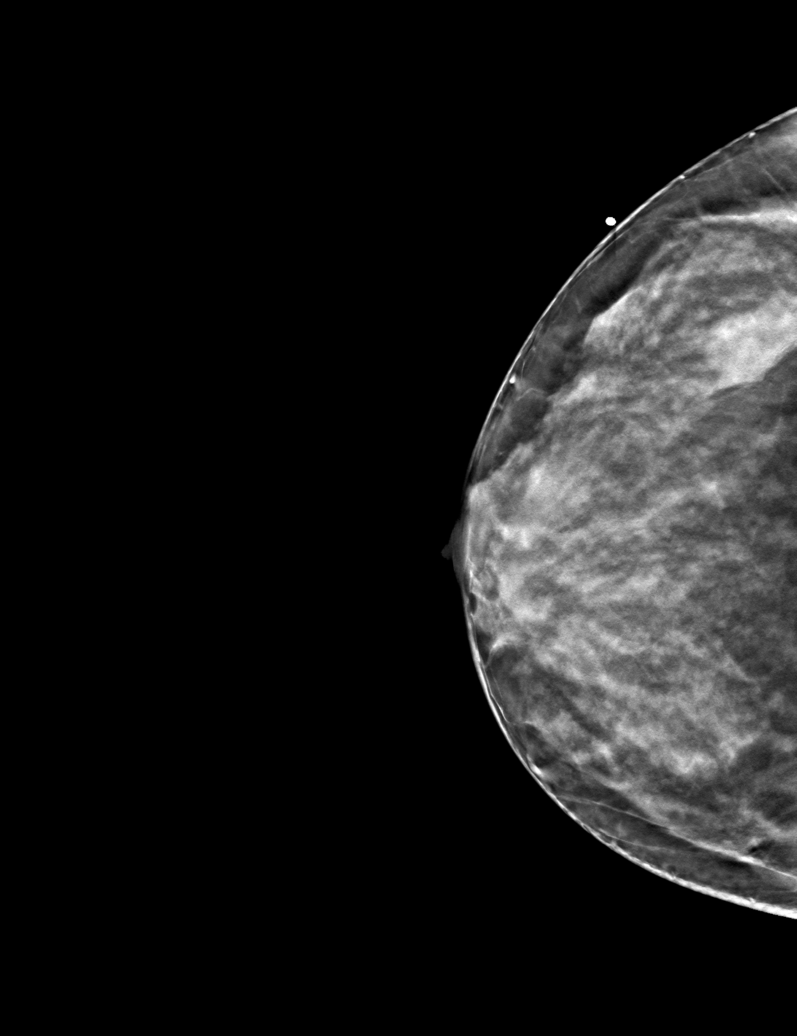

[6 of 30 positions shown; findings below may reference images not displayed]

ACR Breast Density Category c: The breast tissue is heterogeneously
dense, which may obscure small masses.
FINDINGS: There is an oval circumscribed mass in the upper outer right breast,
2 cm in long axis, which corresponds to the palpable mass. There are
no other breast masses, no areas of architectural distortion and no
suspicious calcifications.

Mammographic images were processed with CAD.

On physical exam, there is a smooth mobile palpable mass in the
upper outer right breast.

Targeted ultrasound is performed, showing a simple slightly bilobed,
smoothly marginated cyst in the right breast at 9:30 o'clock, 4 cm
the nipple, measuring 2.3 x 1.1 x 1.5 cm. This corresponds to the
palpable abnormality in the mammographic mass.
IMPRESSION: 1. No evidence of breast malignancy.
2. Benign right breast cyst.

RECOMMENDATION:
Screening mammogram in one year.(Code:D0-P-PCD)

I have discussed the findings and recommendations with the patient.
If applicable, a reminder letter will be sent to the patient
regarding the next appointment.

BI-RADS CATEGORY  2: Benign.

## 2022-04-27 ENCOUNTER — Other Ambulatory Visit (HOSPITAL_COMMUNITY): Payer: Self-pay
